# Patient Record
Sex: Female | Born: 1965 | Race: White | Hispanic: No | Marital: Married | Smoking: Never smoker
Health system: Southern US, Community
[De-identification: ages and names within clinical notes are randomized; demographics above are authoritative.]

## PROBLEM LIST (undated history)

## (undated) DIAGNOSIS — K589 Irritable bowel syndrome without diarrhea: Secondary | ICD-10-CM

## (undated) DIAGNOSIS — M549 Dorsalgia, unspecified: Secondary | ICD-10-CM

## (undated) DIAGNOSIS — R011 Cardiac murmur, unspecified: Secondary | ICD-10-CM

## (undated) DIAGNOSIS — A0472 Enterocolitis due to Clostridium difficile, not specified as recurrent: Secondary | ICD-10-CM

## (undated) DIAGNOSIS — R519 Headache, unspecified: Secondary | ICD-10-CM

## (undated) DIAGNOSIS — K219 Gastro-esophageal reflux disease without esophagitis: Secondary | ICD-10-CM

## (undated) DIAGNOSIS — Z9889 Other specified postprocedural states: Secondary | ICD-10-CM

## (undated) DIAGNOSIS — R51 Headache: Secondary | ICD-10-CM

## (undated) DIAGNOSIS — M5126 Other intervertebral disc displacement, lumbar region: Secondary | ICD-10-CM

## (undated) DIAGNOSIS — K5790 Diverticulosis of intestine, part unspecified, without perforation or abscess without bleeding: Secondary | ICD-10-CM

## (undated) DIAGNOSIS — F419 Anxiety disorder, unspecified: Secondary | ICD-10-CM

## (undated) DIAGNOSIS — K635 Polyp of colon: Secondary | ICD-10-CM

## (undated) DIAGNOSIS — R251 Tremor, unspecified: Secondary | ICD-10-CM

## (undated) DIAGNOSIS — R112 Nausea with vomiting, unspecified: Secondary | ICD-10-CM

## (undated) HISTORY — PX: COLONOSCOPY: SHX174

## (undated) HISTORY — PX: ESOPHAGOGASTRODUODENOSCOPY ENDOSCOPY: SHX5814

## (undated) HISTORY — DX: Anxiety disorder, unspecified: F41.9

## (undated) HISTORY — PX: OTHER SURGICAL HISTORY: SHX169

## (undated) HISTORY — PX: DILATION AND CURETTAGE OF UTERUS: SHX78

## (undated) HISTORY — PX: POLYPECTOMY: SHX149

---

## 2015-07-03 ENCOUNTER — Other Ambulatory Visit: Payer: Self-pay | Admitting: Family Medicine

## 2015-07-03 ENCOUNTER — Ambulatory Visit
Admission: RE | Admit: 2015-07-03 | Discharge: 2015-07-03 | Disposition: A | Discharge status: Home / Self Care | Payer: Medicaid Other | Source: Ambulatory Visit | Attending: Family Medicine | Admitting: Family Medicine

## 2015-07-03 DIAGNOSIS — R1031 Right lower quadrant pain: Secondary | ICD-10-CM | POA: Diagnosis present

## 2015-07-03 MED ORDER — IOHEXOL 300 MG/ML  SOLN
100.0000 mL | Freq: Once | INTRAMUSCULAR | Status: AC | PRN
  Administered 2015-07-03: 100 mL via INTRAVENOUS

## 2015-12-18 ENCOUNTER — Encounter: Payer: Self-pay | Admitting: Obstetrics and Gynecology

## 2015-12-18 ENCOUNTER — Other Ambulatory Visit: Payer: Self-pay | Admitting: Obstetrics and Gynecology

## 2015-12-18 ENCOUNTER — Ambulatory Visit (INDEPENDENT_AMBULATORY_CARE_PROVIDER_SITE_OTHER): Payer: Medicaid Other | Admitting: Obstetrics and Gynecology

## 2015-12-18 VITALS — BP 95/68 | HR 71 | Ht 60.0 in | Wt 169.6 lb

## 2015-12-18 DIAGNOSIS — E669 Obesity, unspecified: Secondary | ICD-10-CM | POA: Diagnosis not present

## 2015-12-18 DIAGNOSIS — N920 Excessive and frequent menstruation with regular cycle: Secondary | ICD-10-CM | POA: Diagnosis not present

## 2015-12-18 DIAGNOSIS — R5383 Other fatigue: Secondary | ICD-10-CM | POA: Diagnosis not present

## 2015-12-18 DIAGNOSIS — Z01419 Encounter for gynecological examination (general) (routine) without abnormal findings: Secondary | ICD-10-CM

## 2015-12-18 DIAGNOSIS — Z0000 Encounter for general adult medical examination without abnormal findings: Secondary | ICD-10-CM

## 2015-12-18 NOTE — Progress Notes (Signed)
Subjective:   Jordan Peters is a 50 y.o. G73P0 Caucasian female here for a routine well-woman exam.  Patient's last menstrual period was 12/10/2015 (exact date).    Current complaints: fatigue, menses heavy monthly, inability to lose weight with regular exercise PCP: none local       Does need labs  Social History: Sexual: heterosexual Marital Status: married Living situation: with family Occupation: homemaker Tobacco/alcohol: no tobacco use Illicit drugs: no history of illicit drug use  The following portions of the patient's history were reviewed and updated as appropriate: allergies, current medications, past family history, past medical history, past social history, past surgical history and problem list.  Past Medical History Past Medical History  Diagnosis Date  . Anxiety     Past Surgical History Past Surgical History  Procedure Laterality Date  . Uterine cyst      Gynecologic History G2P0  Patient's last menstrual period was 12/10/2015 (exact date). Contraception: vasectomy Last Pap: 2015. Results were: normal Last mammogram: 2015. Results were: normal  Obstetric History OB History  Gravida Para Term Preterm AB SAB TAB Ectopic Multiple Living  2         2    # Outcome Date GA Lbr Len/2nd Weight Sex Delivery Anes PTL Lv  2 Gravida 2001    F Vag-Spont   Y  1 Gravida 1998    F Vag-Spont   Y      Current Medications No current outpatient prescriptions on file prior to visit.   No current facility-administered medications on file prior to visit.    Review of Systems Patient denies any headaches, blurred vision, shortness of breath, chest pain, abdominal pain, problems with bowel movements, urination, or intercourse.  Objective:  BP 95/68 mmHg  Pulse 71  Ht 5' (1.524 m)  Wt 169 lb 9.6 oz (76.93 kg)  BMI 33.12 kg/m2  LMP 12/10/2015 (Exact Date) Physical Exam  General:  Well developed, well nourished, no acute distress. She is alert and oriented  x3. Skin:  Warm and dry Neck:  Midline trachea, no thyromegaly or nodules Cardiovascular: Regular rate and rhythm, no murmur heard Lungs:  Effort normal, all lung fields clear to auscultation bilaterally Breasts:  No dominant palpable mass, retraction, or nipple discharge Abdomen:  Soft, non tender, no hepatosplenomegaly or masses Pelvic:  External genitalia is normal in appearance.  The vagina is normal in appearance. The cervix is bulbous, no CMT.  Thin prep pap is done with HR HPV cotesting. Uterus is felt to be normal size, shape, and contour.  No adnexal masses or tenderness noted. Extremities:  No swelling or varicosities noted Psych:  She has a normal mood and affect  Assessment:   Healthy well-woman exam Fatigue Menorrhagia with regular cycles Obesity Anxiety under good control with current SSRI  Plan:  Pap and labs obtained Weight loss plan discussed Causes for heavy menses discussed Menopause and peri-menopause discussed F/U 1 year for AE, or sooner if needed Mammogram scheduled Will call if desires to start weight loss program here.  Jolane Bankhead Suzan Nailer, CNM

## 2015-12-18 NOTE — Patient Instructions (Signed)
  Place annual gynecologic exam patient instructions here.  Thank you for enrolling in MyChart. Please follow the instructions below to securely access your online medical record. MyChart allows you to send messages to your doctor, view your test results, manage appointments, and more.   How Do I Sign Up? 1. In your Internet browser, go to [employer] and enter [email or web address]. 2. Click on the Sign Up Now link in the Sign In box. You will see the New Member Sign Up page. 3. Enter your MyChart Access Code exactly as it appears below. You will not need to use this code after you've completed the sign-up process. If you do not sign up before the expiration date, you must request a new code.  MyChart Access Code: [email or web address] Expires: 02/15/2016  9:50 AM  4. Enter your Social Security Number ([id number]) and Date of Birth (mm/dd/yyyy) as indicated and click Submit. You will be taken to the next sign-up page. 5. Create a MyChart ID. This will be your MyChart login ID and cannot be changed, so think of one that is secure and easy to remember. 6. Create a MyChart password. You can change your password at any time. 7. Enter your Password Reset Question and Answer. This can be used at a later time if you forget your password.  8. Enter your e-mail address. You will receive e-mail notification when new information is available in MyChart. 9. Click Sign Up. You can now view your medical record.   Additional Information Remember, MyChart is NOT to be used for urgent needs. For medical emergencies, dial 911.

## 2015-12-19 ENCOUNTER — Other Ambulatory Visit: Payer: Self-pay | Admitting: Obstetrics and Gynecology

## 2015-12-19 DIAGNOSIS — E559 Vitamin D deficiency, unspecified: Secondary | ICD-10-CM | POA: Insufficient documentation

## 2015-12-19 LAB — COMPREHENSIVE METABOLIC PANEL
A/G RATIO: 1.7 (ref 1.1–2.5)
ALT: 16 IU/L (ref 0–32)
AST: 21 IU/L (ref 0–40)
Albumin: 4.4 g/dL (ref 3.5–5.5)
Alkaline Phosphatase: 72 IU/L (ref 39–117)
BILIRUBIN TOTAL: 0.3 mg/dL (ref 0.0–1.2)
BUN/Creatinine Ratio: 11 (ref 9–23)
BUN: 7 mg/dL (ref 6–24)
CALCIUM: 9.1 mg/dL (ref 8.7–10.2)
CHLORIDE: 100 mmol/L (ref 96–106)
CO2: 20 mmol/L (ref 18–29)
Creatinine, Ser: 0.66 mg/dL (ref 0.57–1.00)
GFR, EST AFRICAN AMERICAN: 122 mL/min/{1.73_m2} (ref >59)
GFR, EST NON AFRICAN AMERICAN: 106 mL/min/{1.73_m2} (ref >59)
GLOBULIN, TOTAL: 2.6 g/dL (ref 1.5–4.5)
Glucose: 71 mg/dL (ref 65–99)
POTASSIUM: 4.1 mmol/L (ref 3.5–5.2)
SODIUM: 139 mmol/L (ref 134–144)
TOTAL PROTEIN: 7 g/dL (ref 6.0–8.5)

## 2015-12-19 LAB — CBC
HEMATOCRIT: 41.9 % (ref 34.0–46.6)
Hemoglobin: 13.2 g/dL (ref 11.1–15.9)
MCH: 27.3 pg (ref 26.6–33.0)
MCHC: 31.5 g/dL (ref 31.5–35.7)
MCV: 87 fL (ref 79–97)
PLATELETS: 355 10*3/uL (ref 150–379)
RBC: 4.84 x10E6/uL (ref 3.77–5.28)
RDW: 14.5 % (ref 12.3–15.4)
WBC: 6 10*3/uL (ref 3.4–10.8)

## 2015-12-19 LAB — CYTOLOGY - PAP

## 2015-12-19 LAB — THYROID PANEL WITH TSH
Free Thyroxine Index: 2 (ref 1.2–4.9)
T3 Uptake Ratio: 28 % (ref 24–39)
T4 TOTAL: 7.3 ug/dL (ref 4.5–12.0)
TSH: 2.32 u[IU]/mL (ref 0.450–4.500)

## 2015-12-19 LAB — VITAMIN D 25 HYDROXY (VIT D DEFICIENCY, FRACTURES): VIT D 25 HYDROXY: 18.4 ng/mL — AB (ref 30.0–100.0)

## 2015-12-19 LAB — HEMOGLOBIN A1C
ESTIMATED AVERAGE GLUCOSE: 111 mg/dL
HEMOGLOBIN A1C: 5.5 % (ref 4.8–5.6)

## 2015-12-19 LAB — VITAMIN B12: VITAMIN B 12: 439 pg/mL (ref 211–946)

## 2015-12-19 LAB — IRON: Iron: 118 ug/dL (ref 27–159)

## 2015-12-19 MED ORDER — VITAMIN D (ERGOCALCIFEROL) 1.25 MG (50000 UNIT) PO CAPS: 50000.0000 [IU] | ORAL_CAPSULE | ORAL | Status: DC

## 2015-12-20 ENCOUNTER — Telehealth: Payer: Self-pay | Admitting: *Deleted

## 2015-12-20 NOTE — Telephone Encounter (Signed)
Mailed info on Vit d 

## 2015-12-20 NOTE — Telephone Encounter (Signed)
-----   Message from Purcell Nails, [place] sent at 12/19/2015 12:47 PM EST ----- Please mail info on vit D and schedule lab appt in 3 months-oders are in

## 2015-12-31 ENCOUNTER — Ambulatory Visit
Admission: RE | Admit: 2015-12-31 | Discharge: 2015-12-31 | Disposition: A | Discharge status: Home / Self Care | Payer: Medicaid Other | Source: Ambulatory Visit | Attending: Obstetrics and Gynecology | Admitting: Obstetrics and Gynecology

## 2015-12-31 DIAGNOSIS — Z1231 Encounter for screening mammogram for malignant neoplasm of breast: Secondary | ICD-10-CM | POA: Diagnosis present

## 2015-12-31 DIAGNOSIS — Z01419 Encounter for gynecological examination (general) (routine) without abnormal findings: Secondary | ICD-10-CM

## 2016-01-31 ENCOUNTER — Encounter: Payer: Self-pay | Admitting: Obstetrics and Gynecology

## 2016-01-31 ENCOUNTER — Ambulatory Visit (INDEPENDENT_AMBULATORY_CARE_PROVIDER_SITE_OTHER): Payer: Medicaid Other | Admitting: Obstetrics and Gynecology

## 2016-01-31 VITALS — BP 111/85 | HR 77 | Ht 60.0 in | Wt 169.3 lb

## 2016-01-31 DIAGNOSIS — E669 Obesity, unspecified: Secondary | ICD-10-CM | POA: Diagnosis not present

## 2016-01-31 MED ORDER — PHENTERMINE HCL 37.5 MG PO TABS: 37.5000 mg | ORAL_TABLET | Freq: Every day | ORAL | Status: DC

## 2016-01-31 MED ORDER — CYANOCOBALAMIN 1000 MCG/ML IJ SOLN: 1000.0000 ug | Freq: Once | INTRAMUSCULAR | Status: DC

## 2016-01-31 NOTE — Progress Notes (Signed)
Subjective:  Jordan HardyMelanie Peters is a 50 y.o. G2P0 at Unknown being seen today for weight loss management- initial visit.  Patient reports General ROS: negative and reports previous weight loss attempts: weight watches, fad diets, regular exercise.   The following portions of the patient's history were reviewed and updated as appropriate: allergies, current medications, past family history, past medical history, past social history, past surgical history and problem list.   Objective:   Filed Vitals:   01/31/16 1000  BP: 111/85  Pulse: 77  Height: 5' (1.524 m)  Weight: 169 lb 4.8 oz (76.794 kg)    General:  Alert, oriented and cooperative. Patient is in no acute distress.  :   :   :   :   :   :   PE: Well groomed female in no current distress,   Mental Status: Normal mood and affect. Normal behavior. Normal judgment and thought content.   Current BMI: Body mass index is 33.06 kg/(m^2).   Assessment and Plan:  Obesity  1. Obesity (BMI 30-39.9)    Plan: low carb, High protein diet RX for adipex 37.5 mg daily and B12 1000mcg.ml monthly, to start now with first injection given at today's visit. Reviewed side-effects common to both medications and expected outcomes. Increase daily water intake to at least 8 bottle a day, every day.  Goal is to reduse weight by 10% by end of three months, and will re-evaluate then.  RTC in 4 weeks for Nurse visit to check weight & BP, and get next B12 injections.    Please refer to After Visit Summary for other counseling recommendations.    Jordan Peters N [place], CNM   Jordan Peters NIKE Jordan Peters, CNM      Consider the Low Glycemic Index Diet and 6 smaller meals daily .  This boosts your metabolism and regulates your sugars:   Use the protein bar by Atkins because they have lots of fiber in them  Find the low carb flatbreads, tortillas and pita breads for sandwiches:  Joseph's makes a pita bread and a flat bread , available at [facility] and  [employer]; Toufayah makes a low carb flatbread available at [employer] and HT that is 9 net carbs and 100 cal Mission makes a low carb whole wheat tortilla available at [employer],and most grocery stores with 6 net carbs and 210 cal  [place] yogurt can still have a lot of carbs .  Dannon Light N fit has 80 cal and 8 carbs

## 2016-02-26 ENCOUNTER — Other Ambulatory Visit
Admission: RE | Admit: 2016-02-26 | Discharge: 2016-02-26 | Disposition: A | Discharge status: Home / Self Care | Payer: Medicaid Other | Source: Ambulatory Visit | Attending: Nurse Practitioner | Admitting: Nurse Practitioner

## 2016-02-26 DIAGNOSIS — K580 Irritable bowel syndrome with diarrhea: Secondary | ICD-10-CM | POA: Diagnosis present

## 2016-02-26 LAB — GASTROINTESTINAL PANEL BY PCR, STOOL (REPLACES STOOL CULTURE)
Adenovirus F40/41: NOT DETECTED
Astrovirus: NOT DETECTED
Campylobacter species: NOT DETECTED
Cryptosporidium: NOT DETECTED
Cyclospora cayetanensis: NOT DETECTED
E. coli O157: NOT DETECTED
Entamoeba histolytica: NOT DETECTED
Enteroaggregative E coli (EAEC): NOT DETECTED
Enteropathogenic E coli (EPEC): NOT DETECTED
Enterotoxigenic E coli (ETEC): NOT DETECTED
Giardia lamblia: NOT DETECTED
Norovirus GI/GII: NOT DETECTED
Plesimonas shigelloides: DETECTED — AB
Rotavirus A: NOT DETECTED
Salmonella species: NOT DETECTED
Sapovirus (I, II, IV, and V): NOT DETECTED
Shiga like toxin producing E coli (STEC): NOT DETECTED
Shigella/Enteroinvasive E coli (EIEC): NOT DETECTED
Vibrio cholerae: NOT DETECTED
Vibrio species: NOT DETECTED
Yersinia enterocolitica: NOT DETECTED

## 2016-02-26 LAB — C DIFFICILE QUICK SCREEN W PCR REFLEX
C Diff antigen: NEGATIVE
C Diff interpretation: NEGATIVE
C Diff toxin: NEGATIVE

## 2016-02-28 ENCOUNTER — Ambulatory Visit (INDEPENDENT_AMBULATORY_CARE_PROVIDER_SITE_OTHER): Payer: Medicaid Other | Admitting: Obstetrics and Gynecology

## 2016-02-28 VITALS — BP 108/72 | HR 83 | Wt 159.2 lb

## 2016-02-28 DIAGNOSIS — E669 Obesity, unspecified: Secondary | ICD-10-CM

## 2016-02-28 MED ORDER — CYANOCOBALAMIN 1000 MCG/ML IJ SOLN
1000.0000 ug | Freq: Once | INTRAMUSCULAR | Status: AC
  Administered 2016-02-28: 1000 ug via INTRAMUSCULAR

## 2016-02-28 NOTE — Progress Notes (Signed)
Patient ID: Jordan Peters, female   DOB: 04/08/1966, 50 y.o.   MRN: [id number] Pt presents for weight, B/P, B-12 injection. No side effects of medication-Phentermine, or B-12.  Weight loss of 10 lbs. Encouraged eating healthy and exercise. Pt had questions about her heart rate higher now. Usually pulse in 70's but may be as high as 176 when running or jogging. Pt informed that an increase pulse rate is normal when exercising and more so if on phentermine.  Pulse should return to normal. Pt denies any c/o shortening of breath, chest pain, heart palpitations or dizziness. To contact office or ER depending on symptoms if needed. .Marland Kitchen

## 2016-03-14 ENCOUNTER — Other Ambulatory Visit: Payer: Medicaid Other

## 2016-03-14 DIAGNOSIS — E559 Vitamin D deficiency, unspecified: Secondary | ICD-10-CM

## 2016-03-15 LAB — VITAMIN D 25 HYDROXY (VIT D DEFICIENCY, FRACTURES): Vit D, 25-Hydroxy: 82.4 ng/mL (ref 30.0–100.0)

## 2016-03-21 ENCOUNTER — Other Ambulatory Visit
Admission: RE | Admit: 2016-03-21 | Discharge: 2016-03-21 | Disposition: A | Discharge status: Home / Self Care | Payer: Medicaid Other | Source: Ambulatory Visit | Attending: Nurse Practitioner | Admitting: Nurse Practitioner

## 2016-03-21 DIAGNOSIS — R194 Change in bowel habit: Secondary | ICD-10-CM | POA: Diagnosis not present

## 2016-03-21 LAB — GASTROINTESTINAL PANEL BY PCR, STOOL (REPLACES STOOL CULTURE)

## 2016-03-27 ENCOUNTER — Ambulatory Visit: Payer: Medicaid Other

## 2016-03-27 ENCOUNTER — Ambulatory Visit (INDEPENDENT_AMBULATORY_CARE_PROVIDER_SITE_OTHER): Payer: Medicaid Other | Admitting: Obstetrics and Gynecology

## 2016-03-27 VITALS — BP 105/77 | HR 93 | Wt 153.2 lb

## 2016-03-27 DIAGNOSIS — E669 Obesity, unspecified: Secondary | ICD-10-CM

## 2016-03-27 MED ORDER — CYANOCOBALAMIN 1000 MCG/ML IJ SOLN
1000.0000 ug | Freq: Once | INTRAMUSCULAR | Status: AC
  Administered 2016-03-27: 1000 ug via INTRAMUSCULAR

## 2016-03-27 NOTE — Progress Notes (Signed)
Patient ID: Jordan Peters, female   DOB: 10/14/1966, 50 y.o.   MRN: [id number] Pt presents for weight, B/P, B-12 injection. No side effects of medication-Phentermine, or B-12.  Weight loss of  6  lbs. Encouraged eating healthy and exercise.

## 2016-04-24 ENCOUNTER — Encounter: Payer: Self-pay | Admitting: Obstetrics and Gynecology

## 2016-04-24 ENCOUNTER — Ambulatory Visit (INDEPENDENT_AMBULATORY_CARE_PROVIDER_SITE_OTHER): Payer: Medicaid Other | Admitting: Obstetrics and Gynecology

## 2016-04-24 VITALS — BP 85/62 | HR 91 | Wt 150.3 lb

## 2016-04-24 DIAGNOSIS — E669 Obesity, unspecified: Secondary | ICD-10-CM | POA: Diagnosis not present

## 2016-04-24 MED ORDER — PHENTERMINE HCL 37.5 MG PO TABS: 37.5000 mg | ORAL_TABLET | Freq: Every day | ORAL | Status: DC

## 2016-04-24 MED ORDER — CYANOCOBALAMIN 1000 MCG/ML IJ SOLN: 1000.0000 ug | Freq: Once | INTRAMUSCULAR | Status: DC

## 2016-04-24 NOTE — Progress Notes (Signed)
Patient ID: Jordan HardyMelanie Bierly, female   DOB: 09/22/1966, 50 y.o.   MRN: [id number] Pt is here for refill on her Phentermine, she is doing well, denies any s/e, pt desires refill on the phentermine  Starting weight was 169lb- 01/31/16

## 2016-05-25 ENCOUNTER — Ambulatory Visit
Admission: EM | Admit: 2016-05-25 | Discharge: 2016-05-25 | Disposition: A | Discharge status: Home / Self Care | Payer: Medicaid Other | Attending: Emergency Medicine | Admitting: Emergency Medicine

## 2016-05-25 DIAGNOSIS — G8929 Other chronic pain: Secondary | ICD-10-CM | POA: Diagnosis not present

## 2016-05-25 DIAGNOSIS — M549 Dorsalgia, unspecified: Secondary | ICD-10-CM | POA: Diagnosis not present

## 2016-05-25 DIAGNOSIS — M5432 Sciatica, left side: Secondary | ICD-10-CM | POA: Diagnosis not present

## 2016-05-25 DIAGNOSIS — M5431 Sciatica, right side: Secondary | ICD-10-CM | POA: Diagnosis not present

## 2016-05-25 HISTORY — DX: Dorsalgia, unspecified: M54.9

## 2016-05-25 HISTORY — DX: Other intervertebral disc displacement, lumbar region: M51.26

## 2016-05-25 MED ORDER — KETOROLAC TROMETHAMINE 60 MG/2ML IM SOLN
60.0000 mg | Freq: Once | INTRAMUSCULAR | Status: AC
  Administered 2016-05-25: 60 mg via INTRAMUSCULAR

## 2016-05-25 MED ORDER — BACLOFEN 10 MG PO TABS: 10.0000 mg | ORAL_TABLET | Freq: Three times a day (TID) | ORAL | 0 refills | Status: DC

## 2016-05-25 MED ORDER — CLONAZEPAM 0.5 MG PO TABS: 0.5000 mg | ORAL_TABLET | Freq: Two times a day (BID) | ORAL | 0 refills | Status: AC | PRN

## 2016-05-25 MED ORDER — NAPROXEN 500 MG PO TABS: 500.0000 mg | ORAL_TABLET | Freq: Two times a day (BID) | ORAL | 0 refills | Status: DC

## 2016-05-25 NOTE — ED Provider Notes (Signed)
CSN: [id number]     Arrival date & time 05/25/16  1302 History   None    Chief Complaint  Patient presents with  . Back Pain    Pt with right low back pain and shooting down both legs. Right leg feeling numb at times. Pt reports long hx of sciatica and herniated disc. Exacerbated by ? recent boot camp. Seen by [facility] Friday. Pain 9/10    (Consider location/radiation/quality/duration/timing/severity/associated sxs/prior Treatment) HPI  This a 50 year old female who is accompanied by her husband who complains of low back pain with radiation into her right lower extremity in a sciatic distribution and also in the left lower leg and not as intense. Numbness and tingling into her right leg. History of herniated nucleus pulposus being considered for surgery but she did not wish to have that and has had a waxing and waning course since that time. 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 Her previous job she would walk 4 miles per day in the back does not bother her at that time. She has no incontinence. She states sitting seems to be her worst position. She has seen a chiropractor who performed ice TENS and adjustments but they did not do seem to be lasting.  Past Medical History:  Diagnosis Date  . Anxiety   . Back pain   . Lumbar herniated disc    Past Surgical History:  Procedure Laterality Date  . uterine cyst     Family History  Problem Relation Age of Onset  . Breast cancer Paternal Aunt   . Hypertension Father   . Cancer Father     bladder   Social History  Substance Use Topics  . Smoking status: Never Smoker  . Smokeless tobacco: Never Used  . Alcohol use Yes     Comment: social   OB History    Gravida Para Term Preterm AB Living   2         2   SAB TAB Ectopic Multiple Live Births                 Review of Systems  Constitutional: Positive for activity change. Negative for appetite change, chills,  fatigue and fever.  Musculoskeletal: Positive for back pain and myalgias.  All other systems reviewed and are negative.   Allergies  Sulfa antibiotics  Home Medications   Prior to Admission medications   Medication Sig Start Date End Date Taking? Authorizing Provider  diclofenac (CATAFLAM) 50 MG tablet Take 50 mg by mouth 3 (three) times daily.   Yes Historical Provider, MD  FLUoxetine (PROZAC) 10 MG tablet Take 10 mg by mouth daily. Reported on 01/31/2016   Yes Historical Provider, MD  HYDROcodone-acetaminophen (NORCO/VICODIN) 5-325 MG tablet Take 1 tablet by mouth every 6 (six) hours as needed for moderate pain.   Yes Historical Provider, MD  phentermine (ADIPEX-P) 37.5 MG tablet Take 1 tablet (37.5 mg total) by mouth daily before breakfast. 04/24/16  Yes Melody N Shambley, CNM  Vitamin D, Ergocalciferol, (DRISDOL) 50000 units CAPS capsule Take 1 capsule (50,000 Units total) by mouth 2 (two) times a week. 12/19/15  Yes Melody N Shambley, CNM  baclofen (LIORESAL) 10 MG tablet Take 1 tablet (10 mg total) by mouth 3 (three) times daily. 05/25/16   Lutricia Feil, PA-C  clonazePAM (KLONOPIN) 0.5 MG tablet Take 1 tablet (0.5 mg total) by mouth 2 (two) times daily as  needed (muscle spasm). 05/25/16   Lutricia Feil, PA-C  cyanocobalamin (,VITAMIN B-12,) 1000 MCG/ML injection Inject 1 mL (1,000 mcg total) into the muscle once. 04/24/16   Melody N Shambley, CNM  fluticasone (FLONASE) 50 MCG/ACT nasal spray Place into both nostrils daily.    Historical Provider, MD  naproxen (NAPROSYN) 500 MG tablet Take 1 tablet (500 mg total) by mouth 2 (two) times daily with a meal. 05/25/16   Lutricia Feil, PA-C   Meds Ordered and Administered this Visit   Medications  ketorolac (TORADOL) injection 60 mg (60 mg Intramuscular Given 05/25/16 1506)    BP 115/71 (BP Location: Left Arm)   Pulse 72   Temp 98.4 F (36.9 C) (Oral)   Resp 20   Ht 5' (1.524 m)   Wt 150 lb (68 kg)   LMP 05/20/2016   SpO2 100%    BMI 29.29 kg/m  No data found.   Physical Exam  Constitutional: She appears well-developed and well-nourished. No distress.  HENT:  Head: Normocephalic and atraumatic.  Eyes: EOM are normal. Pupils are equal, round, and reactive to light.  Neck: Normal range of motion.  Musculoskeletal:  Examination lumbar spine shows a level pelvis in stance. Patient has limitation of motion due to pain and muscle spasm. Is able to toe and heel walk adequately. Sedation in the lower extremities is intact to light touch. Yards are slightly hyperreflexic and symmetrical. EHL peroneal and anterior tibialis are strong to clinical testing. Straight leg raise testing is positive in the sitting position at 45 bilaterally with low back pain. Tenderness is in the sacroiliac joint more on the right than the left also in the lower paraspinous muscles right more than left.  Skin: She is not diaphoretic.  Nursing note and vitals reviewed.   Urgent Care Course   Clinical Course    Procedures (including critical care time)  Labs Review Labs Reviewed - No data to display  Imaging Review No results found.   Visual Acuity Review  Right Eye Distance:   Left Eye Distance:   Bilateral Distance:    Right Eye Near:   Left Eye Near:    Bilateral Near:     Medications  ketorolac (TORADOL) injection 60 mg (60 mg Intramuscular Given 05/25/16 1506)      MDM   1. Bilateral sciatica   2. Chronic back pain    New Prescriptions   NAPROXEN (NAPROSYN) 500 MG TABLET    Take 1 tablet (500 mg total) by mouth 2 (two) times daily with a meal.  Plan: 1. Test/x-ray results and diagnosis reviewed with patient 2. rx as per orders; risks, benefits, potential side effects reviewed with patient 3. Recommend supportive treatment with Rest, heat, and ice for comfort and avoid sitting lifting and bending to exacerbate her symptoms. May find standing at her work desk may be more beneficial. She should stop her boot camp at  this point in time and find another exercise that is not so strenuous on her low back. Of course she should start  only after she has resolved the current issues. Follow up with her primary care tomorrow. 4. F/u prn if symptoms worsen or don't improve     Lutricia Feil, PA-C 05/25/16 1518

## 2016-05-29 ENCOUNTER — Ambulatory Visit: Payer: Medicaid Other

## 2016-06-04 ENCOUNTER — Encounter: Payer: Self-pay | Admitting: Physical Therapy

## 2016-06-04 ENCOUNTER — Ambulatory Visit: Payer: Managed Care, Other (non HMO) | Attending: Pediatrics | Admitting: Physical Therapy

## 2016-06-04 DIAGNOSIS — M6281 Muscle weakness (generalized): Secondary | ICD-10-CM | POA: Insufficient documentation

## 2016-06-04 DIAGNOSIS — R262 Difficulty in walking, not elsewhere classified: Secondary | ICD-10-CM | POA: Diagnosis present

## 2016-06-04 DIAGNOSIS — M5441 Lumbago with sciatica, right side: Secondary | ICD-10-CM | POA: Insufficient documentation

## 2016-06-04 DIAGNOSIS — M5442 Lumbago with sciatica, left side: Secondary | ICD-10-CM | POA: Diagnosis present

## 2016-06-04 DIAGNOSIS — R293 Abnormal posture: Secondary | ICD-10-CM | POA: Insufficient documentation

## 2016-06-04 NOTE — Therapy (Signed)
[facility] [address]. [place], [place], [address] Phone: [phone]   Fax:  [phone]  Physical Therapy Treatment  Patient Details  Name: Jordan Peters MRN: [id number] Date of Birth: 1966-09-30 Referring Provider: Jackelyn Hoehn MD  Encounter Date: 06/04/2016      PT End of Session - 06/04/16 1226    Visit Number 1   Number of Visits 8   Date for PT Re-Evaluation 06/06/15   PT Start Time 0901   PT Stop Time 0946   PT Time Calculation (min) 45 min   Activity Tolerance Patient tolerated treatment well;Patient limited by pain   Behavior During Therapy [facility] for tasks assessed/performed      Past Medical History:  Diagnosis Date  . Anxiety   . Back pain   . Lumbar herniated disc     Past Surgical History:  Procedure Laterality Date  . uterine cyst      There were no vitals filed for this visit.      Subjective Assessment - 06/04/16 1124    Subjective Pt reports chronic hx of LBP and sciatica spanning over 20 years. She states that in the past she has recieved physical therapy services in [place] with benefit. Her current bout of low back pain started approx 2 weeks ago following a [employer]. She went to Urgent Care where she recieved a cortisone injection; states she had immediate benefit. She was started on a prednisone taper to control LBP and has found mild relief with that but still reports approx 5/10 LBP with R/L sciatica pain currently.    Pertinent History Pt with chronic hx of LBP and sciatica (approx 20 years). MRI in 2012 revealed herniated disc. Patient recieved benefit from [facility] extension based physical therapy.   Limitations Walking;House hold activities   How long can you walk comfortably? 1 mile; reports onset of LBP   Patient Stated Goals pt would like to participate in exercise program with no c/o    Currently in Pain? Yes   Pain Score 5    Pain Location Back   Pain Orientation Lower   Pain Onset 1 to 4 weeks ago   Aggravating Factors  walking, lifting, bending forward, activities that incorporate lumbar flexion   Pain Relieving Factors lumbar extension, ice, TENS          Objective:  Therapeutic Exercise: Hooklying piriformis stretch 45sec x2. Standing lumbar ext x10. Prone McKenzie ext x10. Issued core stability progression program: reviewed TA activation and pelvic tilt with TA activation 10 sec holds x5.   Pt response for medical necessity: Pt presents with generalized hypomobility of lumbar spine with sciatica in bilateral LE. She experiences relief of LBP sx with prone lying and is able to tolerate issued exercises with no increase in pain. She will benefit from skilled PT to progress lumbar spine mobility and develop core strength to promote return to active lifestyle.       PT Education - 06/04/16 1222    Education provided Yes   Education Details see HEP; TA progression   Person(s) Educated Patient   Methods Explanation;Demonstration;Handout   Comprehension Verbalized understanding;Returned demonstration;Verbal cues required            PT Long Term Goals - 06/04/16 1237      PT LONG TERM GOAL #1   Title Pt will score <35% on Modified Oswestry to increase self percieved mobility   Baseline 8/9: 50% Severe Self Percieved Disability   Time  4   Period Weeks   Status New     PT LONG TERM GOAL #2   Title Pt will be able to touch her toes without >2/10 LBP to promote return to PLOF   Baseline pt able to tolerate fingertips to proximal knee before LBP >5/10   Time 4   Period Weeks   Status New     PT LONG TERM GOAL #3   Title Pt will be able to lift 5lbs with no <3/10 LBP with proper biomechanics/posture   Baseline pt only able to lift very light weights with 5/10 LBP   Time 4   Period Weeks   Status New     PT LONG TERM GOAL #4   Title Pt will be able to ambulate 2 miles with >3/10 LBP to promote return to regular exercise program    Baseline pt able to tolerate <1 mile; currently unable to exercise   Time 4   Period Weeks   Status New             Plan - 06/04/16 1226    Clinical Impression Statement Pt is a pleasant 50 year old female referred to physical therapy for acute exacerbation of chronic LBP and sciatica. Pain: Current 5/10, Worst 10/10, Best 0/10 following injection. Agg Factors: forward reaching, walking > 1 mile Ease Factors: Ice/TENS. ROM: Lumbar flx: finger tips proximal to knee at onset of LBP, Lumbar ext: mild hypomobility x10 with no reduction of sx. L LF: right sided LBP. R LF: right sided sciatica. R rot: central LBP. L rot: R sided pain. Strength: hip flexion R/L 4-/4, knee ext/flx: pain limited due to sciatica, dorsi/planar: 5/5 bilaterally, hip extension 4/4. Sensation: pt intact to LT; reports mild N/T in R leg. Pt denies bowel bladder issues. Palpation: CPA to lumbar spine generally hypomobile with pt reporting "feel good" sensation with Grade II-IV passive accessories. Muscle length: tight piriformis bilaterally with R>L. Special Test: Slump Test R/L +, SLR: R+. Outcome Measures:   Rehab Potential Good   Clinical Impairments Affecting Rehab Potential Positive: Age, Previous response to therapy, Active. Negative: Chronic   PT Frequency 2x / week   PT Duration 4 weeks   PT Treatment/Interventions ADLs/Self Care Home Management;Aquatic Therapy;Cryotherapy;Electrical Stimulation;Moist Heat;Traction;Gait training;Stair training;Functional mobility training;Therapeutic activities;Therapeutic exercise;Balance training;Neuromuscular re-education;Patient/family education;Manual techniques   PT Next Visit Plan passive accessory to lumbar with ext, muscle length testing, core progression   Consulted and Agree with Plan of Care Patient      Patient will benefit from skilled therapeutic intervention in order to improve the following deficits and impairments:     Visit Diagnosis: Difficulty in walking, not  elsewhere classified  Bilateral low back pain with sciatica, sciatica laterality unspecified  Muscle weakness (generalized)  Abnormal posture     Problem List Patient Active Problem List   Diagnosis Date Noted  . Vitamin D deficiency 12/19/2015   Cammie McgeeMichael C Sherk, PT, DPT # [phone] Michaelyn BarterLaura Kayln Garceau, SPT 06/05/2016, 1:05 PM  [facility] [address] [place], [place], [address] Phone: [phone]   Fax:  [phone]  Name: Jordan Peters MRN: [id number] Date of Birth: 07/03/1966

## 2016-06-06 ENCOUNTER — Ambulatory Visit: Payer: Medicaid Other

## 2016-06-10 ENCOUNTER — Ambulatory Visit: Payer: Managed Care, Other (non HMO) | Admitting: Physical Therapy

## 2016-06-10 ENCOUNTER — Encounter: Payer: Self-pay | Admitting: Physical Therapy

## 2016-06-10 DIAGNOSIS — M5441 Lumbago with sciatica, right side: Secondary | ICD-10-CM | POA: Diagnosis present

## 2016-06-10 DIAGNOSIS — M5442 Lumbago with sciatica, left side: Secondary | ICD-10-CM | POA: Diagnosis present

## 2016-06-10 DIAGNOSIS — R293 Abnormal posture: Secondary | ICD-10-CM

## 2016-06-10 DIAGNOSIS — M6281 Muscle weakness (generalized): Secondary | ICD-10-CM | POA: Diagnosis present

## 2016-06-10 DIAGNOSIS — R262 Difficulty in walking, not elsewhere classified: Secondary | ICD-10-CM

## 2016-06-10 NOTE — Therapy (Signed)
[place] [facility] [address] [place], [place], [address] Phone: [phone]   Fax:  [phone]  Physical Therapy Treatment  Patient Details  Name: Jordan HardyMelanie Sleight MRN: [id number] Date of Birth: 09/13/1966 Referring Provider: Jackelyn HoehnKaren Jutta Behling MD  Encounter Date: 06/10/2016      PT End of Session - 06/10/16 1526    Visit Number 2   Number of Visits 8   Date for PT Re-Evaluation 06/06/15   PT Start Time 0931   PT Stop Time 1024   PT Time Calculation (min) 53 min   Activity Tolerance Patient tolerated treatment well   Behavior During Therapy [facility] for tasks assessed/performed      Past Medical History:  Diagnosis Date  . Anxiety   . Back pain   . Lumbar herniated disc     Past Surgical History:  Procedure Laterality Date  . uterine cyst      There were no vitals filed for this visit.      Subjective Assessment - 06/10/16 0935    Subjective Pt states that she has been performing HEP with mild benefit. Notes that she would prefer not to sit down today due to continued onset of LBP with forward reaching activities. Pt states that prior to back injury she had a tendency for hypermobility and good flexibility for all joints. States that she would like to try meditation/yoga and feels it would be beneficial for management of low back pain. Pt currently wearing icy hot patch at time of appointment.   Pertinent History Pt with chronic hx of LBP and sciatica (approx 20 years). MRI in 2012 revealed herniated disc. [facility].   Limitations Walking;House hold activities   How long can you walk comfortably? 1 mile; reports onset of LBP   Patient Stated Goals pt would like to participate in exercise program with no c/o    Currently in Pain? Yes   Pain Score 3    Pain Location Back   Pain Orientation Lower   Pain Onset More than a month ago   Aggravating Factors  walking,  lifting, bending forward, lumbar flexion      Objective:  Therapeutic Exercise: Standing lumbar ext x10. Prone lumbar ext elbows -- hands 2x10. Lumbar rotation in hooklying x10. TrA activation drills: including isometric TrA hold with pelvic tilt, heel slide, marching, bridge and beginning bike.  Manual tx: STM to R paraspinals, QL and piriformis with palpable release of R paraspinals following tx. Passive accessory mobilizations: PA L2-L5, central sacral mobilization (2 bouts ea, 3 minutes); pt performed lumbar ext snag following ea bout.   Pt response for medical necessity: Pt demonstrates hypomobility of lumbar spine ROM and passive accessories with ext preference. She demonstrates good tolerance of manual therapy this treatment with no increase in LBP and mild release of surrounding musculature.        PT Long Term Goals - 06/04/16 1237      PT LONG TERM GOAL #1   Title Pt will score <35% on Modified Oswestry to increase self percieved mobility   Baseline 8/9: 50% Severe Self Percieved Disability   Time 4   Period Weeks   Status New     PT LONG TERM GOAL #2   Title Pt will be able to touch her toes without >2/10 LBP to promote return to PLOF   Baseline pt able to tolerate fingertips to proximal knee before LBP >5/10   Time  4   Period Weeks   Status New     PT LONG TERM GOAL #3   Title Pt will be able to lift 5lbs with no <3/10 LBP with proper biomechanics/posture   Baseline pt only able to lift very light weights with 5/10 LBP   Time 4   Period Weeks   Status New     PT LONG TERM GOAL #4   Title Pt will be able to ambulate 2 miles with >3/10 LBP to promote return to regular exercise program   Baseline pt able to tolerate <1 mile; currently unable to exercise   Time 4   Period Weeks   Status New           Plan - 06/10/16 1527    Clinical Impression Statement Pt demonstrates functional flexibility of bilateral quadriceps with tight hamstrings/piriformis.  Demonstrates improved tolerance to passive stretching program this session with hamstring/piriformis stretch with only mild LBP with R passive straight leg raise. Pt with moderate hypertonicity of R paraspinals with palpable benefit after STM and passive accessory mobilizations.    Rehab Potential Poor   Clinical Impairments Affecting Rehab Potential Positive: Age, Previous response to therapy, Active. Negative: Chronic   PT Frequency 2x / week   PT Duration 4 weeks   PT Treatment/Interventions ADLs/Self Care Home Management;Aquatic Therapy;Cryotherapy;Electrical Stimulation;Moist Heat;Traction;Gait training;Stair training;Functional mobility training;Therapeutic activities;Therapeutic exercise;Balance training;Neuromuscular re-education;Patient/family education;Manual techniques   PT Next Visit Plan core progression, cont mckenzie extension based program   PT Home Exercise Plan TrA activation with piriformis/hamstring stretching program   Consulted and Agree with Plan of Care Patient      Patient will benefit from skilled therapeutic intervention in order to improve the following deficits and impairments:  Decreased activity tolerance, Decreased mobility, Decreased range of motion, Decreased strength, Hypomobility, Increased muscle spasms, Impaired flexibility, Improper body mechanics, Pain  Visit Diagnosis: Difficulty in walking, not elsewhere classified  Bilateral low back pain with sciatica, sciatica laterality unspecified  Muscle weakness (generalized)  Abnormal posture     Problem List Patient Active Problem List   Diagnosis Date Noted  . Vitamin D deficiency 12/19/2015   Cammie McgeeMichael C Sherk, PT, DPT # [id number] Michaelyn BarterLaura Asiah Browder, SPT 06/11/2016, 9:23 AM  [place] [facility] [address] [place], [place], [address] Phone: [phone]   Fax:  [phone]  Name: Jordan HardyMelanie Gangemi MRN: [id number] Date of Birth: 09/02/1966

## 2016-06-12 ENCOUNTER — Ambulatory Visit: Payer: Managed Care, Other (non HMO) | Admitting: Physical Therapy

## 2016-06-12 ENCOUNTER — Encounter: Payer: Self-pay | Admitting: Physical Therapy

## 2016-06-12 DIAGNOSIS — R262 Difficulty in walking, not elsewhere classified: Secondary | ICD-10-CM | POA: Diagnosis not present

## 2016-06-12 DIAGNOSIS — R293 Abnormal posture: Secondary | ICD-10-CM

## 2016-06-12 DIAGNOSIS — M6281 Muscle weakness (generalized): Secondary | ICD-10-CM

## 2016-06-12 DIAGNOSIS — M5442 Lumbago with sciatica, left side: Secondary | ICD-10-CM

## 2016-06-12 DIAGNOSIS — M5441 Lumbago with sciatica, right side: Secondary | ICD-10-CM

## 2016-06-12 NOTE — Therapy (Signed)
[place] [facility] [place] [facility] [address] [place], [place], [address] Phone: [phone]   Fax:  [phone]  Physical Therapy Treatment  Patient Details  Name: Jordan HardyMelanie Peters MRN: [id number] Date of Birth: 07/17/1966 Referring Provider: Jackelyn HoehnKaren Jutta Behling MD  Encounter Date: 06/12/2016      PT End of Session - 06/12/16 1545    Visit Number 3   Number of Visits 8   Date for PT Re-Evaluation 06/06/15   PT Start Time 0915   PT Stop Time 1001   PT Time Calculation (min) 46 min   Activity Tolerance Patient tolerated treatment well   Behavior During Therapy [facility] for tasks assessed/performed      Past Medical History:  Diagnosis Date  . Anxiety   . Back pain   . Lumbar herniated disc     Past Surgical History:  Procedure Laterality Date  . uterine cyst      There were no vitals filed for this visit.      Subjective Assessment - 06/12/16 1544    Subjective Pt states that she has kept up with HEP and frequently performs prone on elbows when she is getting up in the morning to limit LBP.    Pertinent History Pt with chronic hx of LBP and sciatica (approx 20 years). MRI in 2012 revealed herniated disc. Patient [facility].   Limitations Walking;House hold activities   How long can you walk comfortably? 1 mile; reports onset of LBP   Patient Stated Goals pt would like to participate in exercise program with no c/o    Currently in Pain? No/denies      Objective:  Therapeutic Exercise: Prone ext on elbows x10. Lumbar extension over theraball, supine knee to chest/ bridging/ lumbar rotations with white theraball 20x each, seated hip flexion/ LAQ on theraball with TrA contraction.   Manual tx: STM to R paraspinals, QL and piriformis with palpable release of R paraspinals following tx. Passive accessory mobilizations: UPA L2-L5, CPA L2-L5 central sacral mobilization (2 bouts ea, 1 minutes.  Grade II-III).  Pt response for medical necessity: Pt demonstrates hypomobility of lumbar spine ROM and passive accessories with ext preference. She demonstrates good tolerance of manual therapy this treatment with no increase in LBP and mild release of surrounding musculature       PT Long Term Goals - 06/04/16 1237      PT LONG TERM GOAL #1   Title Pt will score <35% on Modified Oswestry to increase self percieved mobility   Baseline 8/9: 50% Severe Self Percieved Disability   Time 4   Period Weeks   Status New     PT LONG TERM GOAL #2   Title Pt will be able to touch her toes without >2/10 LBP to promote return to PLOF   Baseline pt able to tolerate fingertips to proximal knee before LBP >5/10   Time 4   Period Weeks   Status New     PT LONG TERM GOAL #3   Title Pt will be able to lift 5lbs with no <3/10 LBP with proper biomechanics/posture   Baseline pt only able to lift very light weights with 5/10 LBP   Time 4   Period Weeks   Status New     PT LONG TERM GOAL #4   Title Pt will be able to ambulate 2 miles with >3/10 LBP to promote return to regular exercise program   Baseline pt able to  tolerate <1 mile; currently unable to exercise   Time 4   Period Weeks   Status New               Plan - 06/12/16 1545    Clinical Impression Statement Pt with moderate hypomobility of sacrum, R UPA L2-L5. CPA L2-L5 Tenderness to palpation of R lumbar paraspinal and piriformis. Demonstrates good tolerance of core progression program with no increase in LBP during tx session including seated theraball exercises.   Rehab Potential Good   Clinical Impairments Affecting Rehab Potential Positive: Age, Previous response to therapy, Active. Negative: Chronic   PT Frequency 2x / week   PT Duration 4 weeks   PT Treatment/Interventions ADLs/Self Care Home Management;Aquatic Therapy;Cryotherapy;Electrical Stimulation;Moist Heat;Traction;Gait training;Stair training;Functional mobility  training;Therapeutic activities;Therapeutic exercise;Balance training;Neuromuscular re-education;Patient/family education;Manual techniques   PT Next Visit Plan core progression, cont mckenzie extension based program   PT Home Exercise Plan TrA activation with piriformis/hamstring stretching program   Consulted and Agree with Plan of Care Patient      Patient will benefit from skilled therapeutic intervention in order to improve the following deficits and impairments:  Decreased activity tolerance, Decreased mobility, Decreased range of motion, Decreased strength, Hypomobility, Increased muscle spasms, Impaired flexibility, Improper body mechanics, Pain  Visit Diagnosis: Difficulty in walking, not elsewhere classified  Bilateral low back pain with sciatica, sciatica laterality unspecified  Muscle weakness (generalized)  Abnormal posture     Problem List Patient Active Problem List   Diagnosis Date Noted  . Vitamin D deficiency 12/19/2015   Cammie McgeeMichael C Sherk, PT, DPT # [phone] Vernona RiegerLaura Pansey Pinheiro SPT 06/12/2016, 3:48 PM  [place] [facility] [address] [place], [place], [address] Phone: [phone]   Fax:  [phone]  Name: Jordan HardyMelanie Peters MRN: [id number] Date of Birth: 01/21/1966

## 2016-06-17 ENCOUNTER — Encounter: Payer: Self-pay | Admitting: Physical Therapy

## 2016-06-17 ENCOUNTER — Ambulatory Visit: Payer: Managed Care, Other (non HMO) | Admitting: Physical Therapy

## 2016-06-17 DIAGNOSIS — R293 Abnormal posture: Secondary | ICD-10-CM

## 2016-06-17 DIAGNOSIS — R262 Difficulty in walking, not elsewhere classified: Secondary | ICD-10-CM | POA: Diagnosis not present

## 2016-06-17 DIAGNOSIS — M5442 Lumbago with sciatica, left side: Secondary | ICD-10-CM

## 2016-06-17 DIAGNOSIS — M6281 Muscle weakness (generalized): Secondary | ICD-10-CM

## 2016-06-17 DIAGNOSIS — M5441 Lumbago with sciatica, right side: Secondary | ICD-10-CM

## 2016-06-18 NOTE — Therapy (Signed)
[place] [facility] [address] [place], [place], [address] Phone: [phone]   Fax:  [phone]  Physical Therapy Treatment  Patient Details  Name: Jordan Peters MRN: [id number] Date of Birth: 12/05/1965 Referring Provider: Jackelyn HoehnKaren Jutta Behling MD  Encounter Date: 06/17/2016      PT End of Session - 06/18/16 1314    Visit Number 4   Number of Visits 8   Date for PT Re-Evaluation 07/02/16   PT Start Time 0911   PT Stop Time 1000   PT Time Calculation (min) 49 min   Activity Tolerance Patient tolerated treatment well   Behavior During Therapy [facility] for tasks assessed/performed      Past Medical History:  Diagnosis Date  . Anxiety   . Back pain   . Lumbar herniated disc     Past Surgical History:  Procedure Laterality Date  . uterine cyst      There were no vitals filed for this visit.      Subjective Assessment - 06/18/16 1255    Subjective Pt. entered PT with no new complaints or pain.   Pt. states she has attempted several of the ball ex. that were discussed/ demonstrated last PT tx. session.     Pertinent History Pt with chronic hx of LBP and sciatica (approx 20 years). MRI in 2012 revealed herniated disc. [facility].   Limitations Walking;House hold activities   How long can you walk comfortably? 1 mile; reports onset of LBP   Patient Stated Goals pt would like to participate in exercise program with no c/o    Currently in Pain? No/denies       Objective:  Therapeutic Exercise: Prone ext on elbows x 5 (no pain).  Planks on knees (blue mat table) 5x with 20+ sec. Holds and proper technique.  Lumbar extension over theraball/ lateral flexion on L/R side with white ball 3x each (good technique but extra time to get into position), supine knee to chest/ bridging/ lumbar rotations with white theraball 20x each, seated hip flexion/ LAQ on theraball with TrA  contraction.  Sidelying clamshells L/R 20x each/ hip abd. With SLR 20x each.   Manual tx: STM to R paraspinals, QL and piriformis with palpable release of R paraspinals following tx. Passive accessory mobilizations: UPA L2-L5, CPA L2-L5 central sacral mobilization (2 bouts ea, 1 minutes. Grade II-III).  Supine L/R LE neural glides 3x each (as tolerated)- increase in R lumbar/SI region.    PT discussed/reviewed proper set-up/ placement of IFC TENS (pts. Personal home unit).    Pt response for medical necessity: Pt demonstrates min. hypomobility of lumbar spine ROM and passive accessories with ext preference. She demonstrates good tolerance of manual therapy this treatment with no increase in LBP and mild release of surrounding musculature         PT Long Term Goals - 06/04/16 1237      PT LONG TERM GOAL #1   Title Pt will score <35% on Modified Oswestry to increase self percieved mobility   Baseline 8/9: 50% Severe Self Percieved Disability   Time 4   Period Weeks   Status New     PT LONG TERM GOAL #2   Title Pt will be able to touch her toes without >2/10 LBP to promote return to PLOF   Baseline pt able to tolerate fingertips to proximal knee before LBP >5/10   Time 4   Period Weeks  Status New     PT LONG TERM GOAL #3   Title Pt will be able to lift 5lbs with no <3/10 LBP with proper biomechanics/posture   Baseline pt only able to lift very light weights with 5/10 LBP   Time 4   Period Weeks   Status New     PT LONG TERM GOAL #4   Title Pt will be able to ambulate 2 miles with >3/10 LBP to promote return to regular exercise program   Baseline pt able to tolerate <1 mile; currently unable to exercise   Time 4   Period Weeks   Status New            Plan - 06/18/16 1316    Clinical Impression Statement Pt. able to complete higher level core stability ex./ lumbar ext. and lateral flexion stretches over ball with no increase c/o back pain.  Pt. did have an increase in  R lumbar/SI discomfort during L SLR/ neural glides in spuine position on blue mat table.  Pain resolved quickly with return to resting position.  (+) R lumbar paraspinal/ SI tenderness with palpation and grade II-III mobs.       Rehab Potential Good   Clinical Impairments Affecting Rehab Potential Positive: Age, Previous response to therapy, Active. Negative: Chronic   PT Frequency 2x / week   PT Duration 4 weeks   PT Treatment/Interventions ADLs/Self Care Home Management;Aquatic Therapy;Cryotherapy;Electrical Stimulation;Moist Heat;Traction;Gait training;Stair training;Functional mobility training;Therapeutic activities;Therapeutic exercise;Balance training;Neuromuscular re-education;Patient/family education;Manual techniques   PT Next Visit Plan core progression, cont mckenzie extension based program.  Reassess lumbar mobility and tenderness next tx. session.    PT Home Exercise Plan TrA activation with piriformis/hamstring stretching program   Consulted and Agree with Plan of Care Patient      Patient will benefit from skilled therapeutic intervention in order to improve the following deficits and impairments:  Decreased activity tolerance, Decreased mobility, Decreased range of motion, Decreased strength, Hypomobility, Increased muscle spasms, Impaired flexibility, Improper body mechanics, Pain  Visit Diagnosis: Difficulty in walking, not elsewhere classified  Bilateral low back pain with sciatica, sciatica laterality unspecified  Muscle weakness (generalized)  Abnormal posture     Problem List Patient Active Problem List   Diagnosis Date Noted  . Vitamin D deficiency 12/19/2015   Cammie McgeeMichael C Divya Munshi, PT, DPT # [id number]  06/18/2016, 1:27 PM  [place] [facility] [address] [place], [place], [address] Phone: [phone]   Fax:  [phone]  Name: Jordan Peters MRN: [id number] Date of Birth: 08/07/1966

## 2016-06-19 ENCOUNTER — Ambulatory Visit: Payer: Managed Care, Other (non HMO) | Admitting: Physical Therapy

## 2016-06-19 ENCOUNTER — Encounter: Payer: Self-pay | Admitting: Physical Therapy

## 2016-06-19 DIAGNOSIS — R262 Difficulty in walking, not elsewhere classified: Secondary | ICD-10-CM | POA: Diagnosis not present

## 2016-06-19 DIAGNOSIS — M6281 Muscle weakness (generalized): Secondary | ICD-10-CM

## 2016-06-19 DIAGNOSIS — M5441 Lumbago with sciatica, right side: Secondary | ICD-10-CM

## 2016-06-19 DIAGNOSIS — M5442 Lumbago with sciatica, left side: Secondary | ICD-10-CM

## 2016-06-19 DIAGNOSIS — R293 Abnormal posture: Secondary | ICD-10-CM

## 2016-06-19 NOTE — Therapy (Signed)
[place] [facility] [address] [place], [place], [address] Phone: [phone]   Fax:  [phone]  Physical Therapy Treatment  Patient Details  Name: Jordan Peters MRN: [id number] Date of Birth: Mar 23, 1966 Referring Provider: Jackelyn Hoehn MD  Encounter Date: 06/19/2016      PT End of Session - 06/19/16 0921    Visit Number 5   Number of Visits 8   Date for PT Re-Evaluation 07/02/16   PT Start Time 0915   PT Stop Time 1001   PT Time Calculation (min) 46 min   Activity Tolerance Patient tolerated treatment well;No increased pain   Behavior During Therapy WFL for tasks assessed/performed      Past Medical History:  Diagnosis Date  . Anxiety   . Back pain   . Lumbar herniated disc     Past Surgical History:  Procedure Laterality Date  . uterine cyst      There were no vitals filed for this visit.      Subjective Assessment - 06/19/16 0920    Subjective Pt states she has been feeling a little stiff over the last few days; states she has not been walking regularly. Been busy with family duties and unable to do regular exercise program.   Pertinent History Pt with chronic hx of LBP and sciatica (approx 20 years). MRI in 2012 revealed herniated disc. Patient recieved benefit from [facility] extension based physical therapy.   Limitations Walking;House hold activities   How long can you walk comfortably? 1 mile; reports onset of LBP   Patient Stated Goals pt would like to participate in exercise program with no c/o    Currently in Pain? Yes   Pain Score 3    Pain Location Back   Pain Orientation Lower   Pain Descriptors / Indicators Constant   Pain Onset More than a month ago     Objective:    Therapeutic Exercise: Piriformis stretch 45 sec x2. Hip flexor stretch 45 sec x2. Supine TrA activation exercises including: advanced bike 30 sec x5, SLR with circles 1 minute x2 BLE, bridge 1 minute x2. TG squat with 2# weights  emphasis on core activation bicep curls 3x10, shoulder flexion 3x10, press ups 3x10.    Manual tx: Manual tx: Passive accessory mobilizations: UPA L2-L5, CPA L2-L5central sacral mobilization (2 bouts ea, . Grade II-III).    Pt response for medical necessity: Pt demonstrates mild hypomobility of lumbar spine with initial reported stiffness/ 3/10 pain. Pt responds well to gentle AROM of amb and manual tx with decrease to 0/10 pain. Good tolerance of TrA there ex with no pain; onset of muscle fatigue between 30sec-53minute.       PT Long Term Goals - 06/04/16 1237      PT LONG TERM GOAL #1   Title Pt will score <35% on Modified Oswestry to increase self percieved mobility   Baseline 8/9: 50% Severe Self Percieved Disability   Time 4   Period Weeks   Status New     PT LONG TERM GOAL #2   Title Pt will be able to touch her toes without >2/10 LBP to promote return to PLOF   Baseline pt able to tolerate fingertips to proximal knee before LBP >5/10   Time 4   Period Weeks   Status New     PT LONG TERM GOAL #3   Title Pt will be able to lift 5lbs with no <3/10 LBP with proper biomechanics/posture  Baseline pt only able to lift very light weights with 5/10 LBP   Time 4   Period Weeks   Status New     PT LONG TERM GOAL #4   Title Pt will be able to ambulate 2 miles with >3/10 LBP to promote return to regular exercise program   Baseline pt able to tolerate <1 mile; currently unable to exercise   Time 4   Period Weeks   Status New            Plan - 06/19/16 1221    Clinical Impression Statement Pt demonstrates improvement in LBP and stiffness following short bout of ambulation on treadmill. Demonstrates weakness in glutes/hamstrings when performing bridge with TrA activation with difficulty obtaining neutral spine. Continues to present with tight piriformis L>R. Overall good tolerance of core stability program but with early onset of muscle fatigue.   Rehab Potential Good    Clinical Impairments Affecting Rehab Potential Positive: Age, Previous response to therapy, Active. Negative: Chronic   PT Frequency 2x / week   PT Duration 4 weeks   PT Treatment/Interventions ADLs/Self Care Home Management;Aquatic Therapy;Cryotherapy;Electrical Stimulation;Moist Heat;Traction;Gait training;Stair training;Functional mobility training;Therapeutic activities;Therapeutic exercise;Balance training;Neuromuscular re-education;Patient/family education;Manual techniques   PT Next Visit Plan core progression, cont mckenzie extension based program.    PT Home Exercise Plan TrA activation with piriformis/hamstring stretching program   Consulted and Agree with Plan of Care Patient      Patient will benefit from skilled therapeutic intervention in order to improve the following deficits and impairments:  Decreased activity tolerance, Decreased mobility, Decreased range of motion, Decreased strength, Hypomobility, Increased muscle spasms, Impaired flexibility, Improper body mechanics, Pain  Visit Diagnosis: Difficulty in walking, not elsewhere classified  Bilateral low back pain with sciatica, sciatica laterality unspecified  Muscle weakness (generalized)  Abnormal posture     Problem List Patient Active Problem List   Diagnosis Date Noted  . Vitamin D deficiency 12/19/2015   Cammie McgeeMichael C Sherk, PT, DPT # [phone] Michaelyn BarterLaura Tajuanna Burnett, SPT 06/20/2016, 3:53 PM  [place] [facility] [address] [place], [place], [address] Phone: [phone]   Fax:  [phone]  Name: Jordan HardyMelanie Crossan MRN: [id number] Date of Birth: 01/18/1966

## 2016-06-24 ENCOUNTER — Encounter: Payer: Self-pay | Admitting: Physical Therapy

## 2016-06-24 ENCOUNTER — Ambulatory Visit: Payer: Managed Care, Other (non HMO) | Admitting: Physical Therapy

## 2016-06-24 DIAGNOSIS — M5442 Lumbago with sciatica, left side: Secondary | ICD-10-CM

## 2016-06-24 DIAGNOSIS — R293 Abnormal posture: Secondary | ICD-10-CM

## 2016-06-24 DIAGNOSIS — R262 Difficulty in walking, not elsewhere classified: Secondary | ICD-10-CM | POA: Diagnosis not present

## 2016-06-24 DIAGNOSIS — M6281 Muscle weakness (generalized): Secondary | ICD-10-CM

## 2016-06-24 DIAGNOSIS — M5441 Lumbago with sciatica, right side: Secondary | ICD-10-CM

## 2016-06-24 NOTE — Therapy (Signed)
[place] [facility] [address] [place], [place], [address] Phone: [phone]   Fax:  [phone]  Physical Therapy Treatment  Patient Details  Name: Jordan HardyMelanie Hounshell MRN: [id number] Date of Birth: 12/05/1965 Referring Provider: Jackelyn HoehnKaren Jutta Behling MD  Encounter Date: 06/24/2016      PT End of Session - 06/24/16 0914    Visit Number 6   Number of Visits 8   Date for PT Re-Evaluation 07/02/16   PT Start Time 0830   PT Stop Time 0915   PT Time Calculation (min) 45 min   Activity Tolerance Patient tolerated treatment well;No increased pain   Behavior During Therapy WFL for tasks assessed/performed      Past Medical History:  Diagnosis Date  . Anxiety   . Back pain   . Lumbar herniated disc     Past Surgical History:  Procedure Laterality Date  . uterine cyst      There were no vitals filed for this visit.      Subjective Assessment - 06/24/16 0836    Subjective Pt states that she did over 6 miles of walking in one day over the weekend. Today she reports mild fatigue in anterior thighs and stiffness in low back but no increase in back/leg pain.   Pertinent History Pt with chronic hx of LBP and sciatica (approx 20 years). MRI in 2012 revealed herniated disc. [facility]   Limitations Walking;House hold activities   How long can you walk comfortably? 1 mile; reports onset of LBP   Patient Stated Goals pt would like to participate in exercise program with no c/o    Currently in Pain? No/denies      Objective:  Therapeutic Exercise: Treadmill 1.5mph 5 minutes (warm up/no charge). Hooklying bridge with air ex pad and RTB at proximal thigh 3x10 with 10 sec holds. Bridge with alternating marching with RTB at proximal thigh 3x30. Pelvic tilt x30. Sidelying leg neutral abduction with flexion/extension #2 ankle weight 1 minute x3 ea leg. Sidelying clamshell x10 LLE. Seated on  white theraball marching x30, marching with reciprocal shoulder flexion x30, knee extension with reciprocal shoulder flexion x30. Ext on white theraball x15 sec. Encouraged performance of child's pose and cat/camel after TrA activation drills. Thoracic ext x10. Prone quadricep stretch 2 x45 sec.  Pt response for medical necessity: Pt with good tolerance of progressive TrA strengthening program with no c/o of LBP this session. She demonstrates early onset of LE fatigue with L worse than R but able to complete all therapeutic exercise. Will discuss progression towards independence with HEP next session.        PT Education - 06/24/16 0914    Education provided Yes   Education Details HEP; progressed core stability program. Abduction leg circles with #2   Person(s) Educated Patient   Methods Explanation;Demonstration;Handout   Comprehension Verbalized understanding;Returned demonstration;Verbal cues required             PT Long Term Goals - 06/04/16 1237      PT LONG TERM GOAL #1   Title Pt will score <35% on Modified Oswestry to increase self percieved mobility   Baseline 8/9: 50% Severe Self Percieved Disability   Time 4   Period Weeks   Status New     PT LONG TERM GOAL #2   Title Pt will be able to touch her toes without >2/10 LBP to promote return to PLOF   Baseline pt able  to tolerate fingertips to proximal knee before LBP >5/10   Time 4   Period Weeks   Status New     PT LONG TERM GOAL #3   Title Pt will be able to lift 5lbs with no <3/10 LBP with proper biomechanics/posture   Baseline pt only able to lift very light weights with 5/10 LBP   Time 4   Period Weeks   Status New     PT LONG TERM GOAL #4   Title Pt will be able to ambulate 2 miles with >3/10 LBP to promote return to regular exercise program   Baseline pt able to tolerate <1 mile; currently unable to exercise   Time 4   Period Weeks   Status New            Plan - 06/24/16 4098    Clinical  Impression Statement Pt demonstrates hip flexor/hip abductor weakness greater in LLE than RLE with performance of sidelying leg raise with TrA activation. Presents with mild tightness in bilateral quadriceps. She reports no pain with therapeutic exercises today; demonstrates tendency for moderate anterior tilt but responsive to verbal cueing to correct.   Rehab Potential Good   Clinical Impairments Affecting Rehab Potential Positive: Age, Previous response to therapy, Active. Negative: Chronic   PT Frequency 2x / week   PT Duration 4 weeks   PT Treatment/Interventions ADLs/Self Care Home Management;Aquatic Therapy;Cryotherapy;Electrical Stimulation;Moist Heat;Traction;Gait training;Stair training;Functional mobility training;Therapeutic activities;Therapeutic exercise;Balance training;Neuromuscular re-education;Patient/family education;Manual techniques   PT Next Visit Plan core progression, cont [facility]ckenzie extension based program.    PT Home Exercise Plan TrA activation/progressive exercise program with piriformis/hamstring stretching program   Consulted and Agree with Plan of Care Patient      Patient will benefit from skilled therapeutic intervention in order to improve the following deficits and impairments:  Decreased activity tolerance, Decreased mobility, Decreased range of motion, Decreased strength, Hypomobility, Increased muscle spasms, Impaired flexibility, Improper body mechanics, Pain  Visit Diagnosis: Difficulty in walking, not elsewhere classified  Bilateral low back pain with sciatica, sciatica laterality unspecified  Muscle weakness (generalized)  Abnormal posture     Problem List Patient Active Problem List   Diagnosis Date Noted  . Vitamin D deficiency 12/19/2015   Cammie Mcgee, PT, DPT # [id number] Michaelyn Barter, SPT 06/25/2016, 2:47 PM  [place] [address] [place], [place], [address] Phone: [phone]   Fax:   [phone]  Name: Suhana Wilner MRN: [id number] Date of Birth: [birth year]

## 2016-07-02 ENCOUNTER — Encounter: Payer: Self-pay | Admitting: Physical Therapy

## 2016-07-02 ENCOUNTER — Ambulatory Visit: Payer: Managed Care, Other (non HMO) | Attending: Pediatrics | Admitting: Physical Therapy

## 2016-07-02 DIAGNOSIS — M5442 Lumbago with sciatica, left side: Secondary | ICD-10-CM | POA: Insufficient documentation

## 2016-07-02 DIAGNOSIS — R293 Abnormal posture: Secondary | ICD-10-CM | POA: Diagnosis present

## 2016-07-02 DIAGNOSIS — R262 Difficulty in walking, not elsewhere classified: Secondary | ICD-10-CM | POA: Insufficient documentation

## 2016-07-02 DIAGNOSIS — M6281 Muscle weakness (generalized): Secondary | ICD-10-CM | POA: Diagnosis present

## 2016-07-02 DIAGNOSIS — M5441 Lumbago with sciatica, right side: Secondary | ICD-10-CM | POA: Diagnosis present

## 2016-07-02 NOTE — Therapy (Signed)
[place] [facility] [address] [place], [place], [address] Phone: [phone]   Fax:  [phone]  Physical Therapy Treatment/Discharge  Patient Details  Name: Jordan Peters MRN: [id number] Date of Birth: May 31, 1966 Referring Provider: Jackelyn Hoehn MD  Encounter Date: 07/02/2016      PT End of Session - 07/02/16 1722    Visit Number 7   Number of Visits 8   Date for PT Re-Evaluation 07/02/16   PT Start Time 1600   PT Stop Time 1644   PT Time Calculation (min) 44 min   Activity Tolerance Patient tolerated treatment well;No increased pain   Behavior During Therapy WFL for tasks assessed/performed      Past Medical History:  Diagnosis Date  . Anxiety   . Back pain   . Lumbar herniated disc     Past Surgical History:  Procedure Laterality Date  . uterine cyst      There were no vitals filed for this visit.      Subjective Assessment - 07/02/16 1720    Subjective Pt states that she has been trying to incorporate 30 minutes of exercise daily with added walking program. She reports no c/o with low back in the last week.   Pertinent History Pt with chronic hx of LBP and sciatica (approx 20 years). MRI in 2012 revealed herniated disc. Patient recieved benefit from [facility] extension based physical therapy.   Limitations Walking;House hold activities   Patient Stated Goals pt would like to participate in exercise program with no c/o    Currently in Pain? No/denies      Objective:  Therapeutic Exercise: Treadmill 10 minutes 1. (warm up/no charge). Bird dog with reciprocal UE flex/LE ext x20. Short lever bridge on theraball with no UE support 2x10. Long lever bridge on theraball with no UE support 1x10. Bridge with hamstring curl on theraball 1x10 with min A for ball control. Modified prone plank on knees 1 x 2 minutes. Prone plank 2x <10 sec. Sideplank on knees 1x30 sec. Sideplank 1x15 sec. Discussed progression of  prone/side plank with hip ext/abd respectively to increase difficulty.  Pt response for medical necessity: Pt demonstrates good understanding of progressive core stability/lumbar mobility program. She experiences no instances of low back pain this session and is comfortable with independent management of LBP sx at this time. She will f/u with PT in the future as needed.        PT Education - 07/02/16 1721    Education provided Yes   Education Details Discussed core stability program progression   Person(s) Educated Patient   Methods Explanation;Demonstration;Handout   Comprehension Verbalized understanding;Returned demonstration             PT Long Term Goals - 07/02/16 1725      PT LONG TERM GOAL #1   Title Pt will score <35% on Modified Oswestry to increase self percieved mobility   Baseline 8/9: 50% Severe Self Percieved Disability. 9/6: 2%   Time 4   Period Weeks   Status Achieved     PT LONG TERM GOAL #2   Title Pt will be able to touch her toes without >2/10 LBP to promote return to PLOF   Baseline pt able to tolerate fingertips to proximal knee before LBP >5/10   Time 4   Period Weeks   Status Achieved     PT LONG TERM GOAL #3   Title Pt will be able to lift 5lbs with no <3/10 LBP  with proper biomechanics/posture   Baseline pt only able to lift very light weights with 5/10 LBP   Time 4   Period Weeks   Status Achieved     PT LONG TERM GOAL #4   Title Pt will be able to ambulate 2 miles with >3/10 LBP to promote return to regular exercise program   Baseline pt able to tolerate <1 mile; currently unable to exercise   Time 4   Period Weeks   Status Achieved               Plan - 07/02/16 1723    Clinical Impression Statement Pt demonstrates good understanding of core progression/back mobility exercise plan and is well prepared for independent management of low back pain sx at this time. She has progressed through core stability program and is currently  able to perform prone plank for <10 sec. Pt instructed to progress core stability program independently as she masters each level of difficulty. Pt will f/u with PT as needed.   Rehab Potential Good   Clinical Impairments Affecting Rehab Potential Positive: Age, Previous response to therapy, Active. Negative: Chronic   PT Frequency 2x / week   PT Duration 4 weeks   PT Treatment/Interventions ADLs/Self Care Home Management;Aquatic Therapy;Cryotherapy;Electrical Stimulation;Moist Heat;Traction;Gait training;Stair training;Functional mobility training;Therapeutic activities;Therapeutic exercise;Balance training;Neuromuscular re-education;Patient/family education;Manual techniques   PT Home Exercise Plan TrA activation/progressive exercise program with piriformis/hamstring stretching program   Consulted and Agree with Plan of Care Patient      Patient will benefit from skilled therapeutic intervention in order to improve the following deficits and impairments:  Decreased activity tolerance, Decreased mobility, Decreased range of motion, Decreased strength, Hypomobility, Increased muscle spasms, Impaired flexibility, Improper body mechanics, Pain  Visit Diagnosis: Difficulty in walking, not elsewhere classified  Bilateral low back pain with sciatica, sciatica laterality unspecified  Muscle weakness (generalized)  Abnormal posture     Problem List Patient Active Problem List   Diagnosis Date Noted  . Vitamin D deficiency 12/19/2015   Cammie McgeeMichael C Sherk, PT, DPT # [id number] Vernona RiegerLaura Polo Mcmartin SPT 07/02/2016, 5:27 PM  [place] [facility] [address] [place], [place], [address] Phone: [phone]   Fax:  [phone]  Name: Jordan Peters MRN: [id number] Date of Birth: 09/13/1966

## 2016-11-10 IMAGING — MG MM DIGITAL SCREENING BILAT W/ CAD
4 series · 4 of 4 positions shown · non-contrast
Comparison: Previous exam(s).

CLINICAL DATA: Screening.

EXAM:
DIGITAL SCREENING BILATERAL MAMMOGRAM WITH CAD

[R MLO]
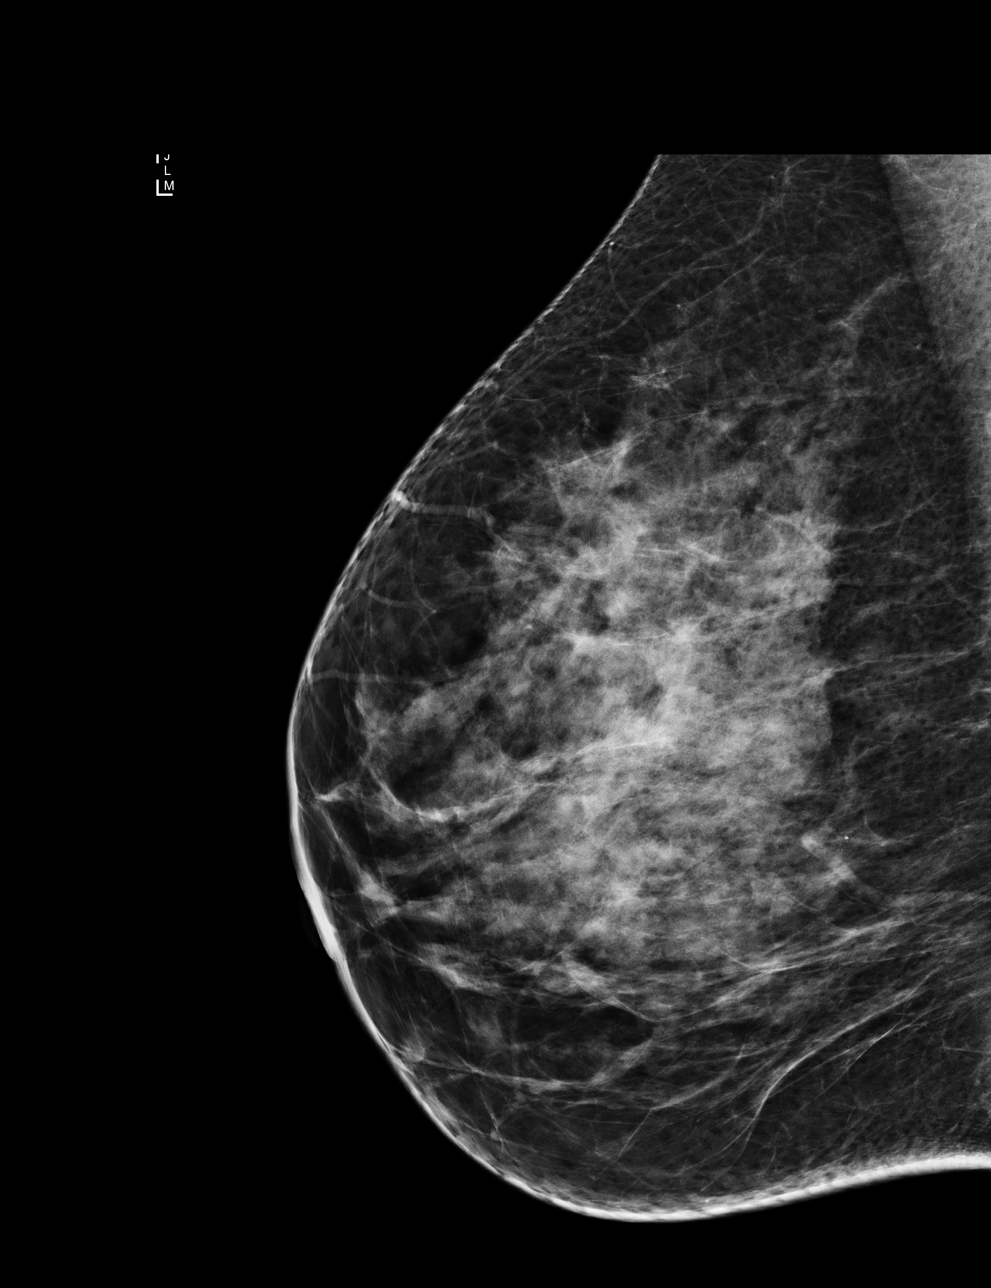

[R CC]
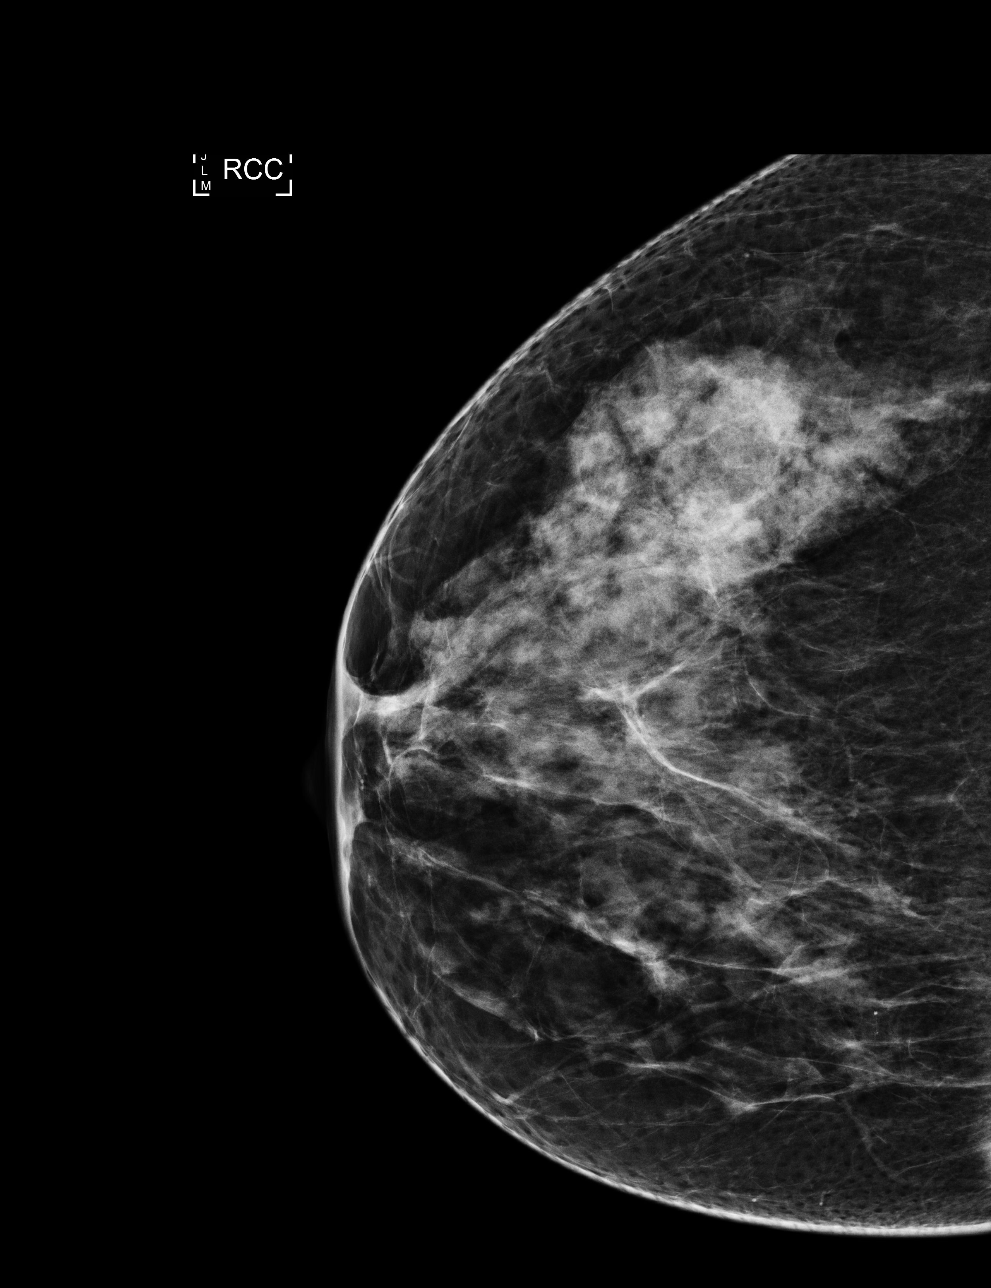

[L CC]
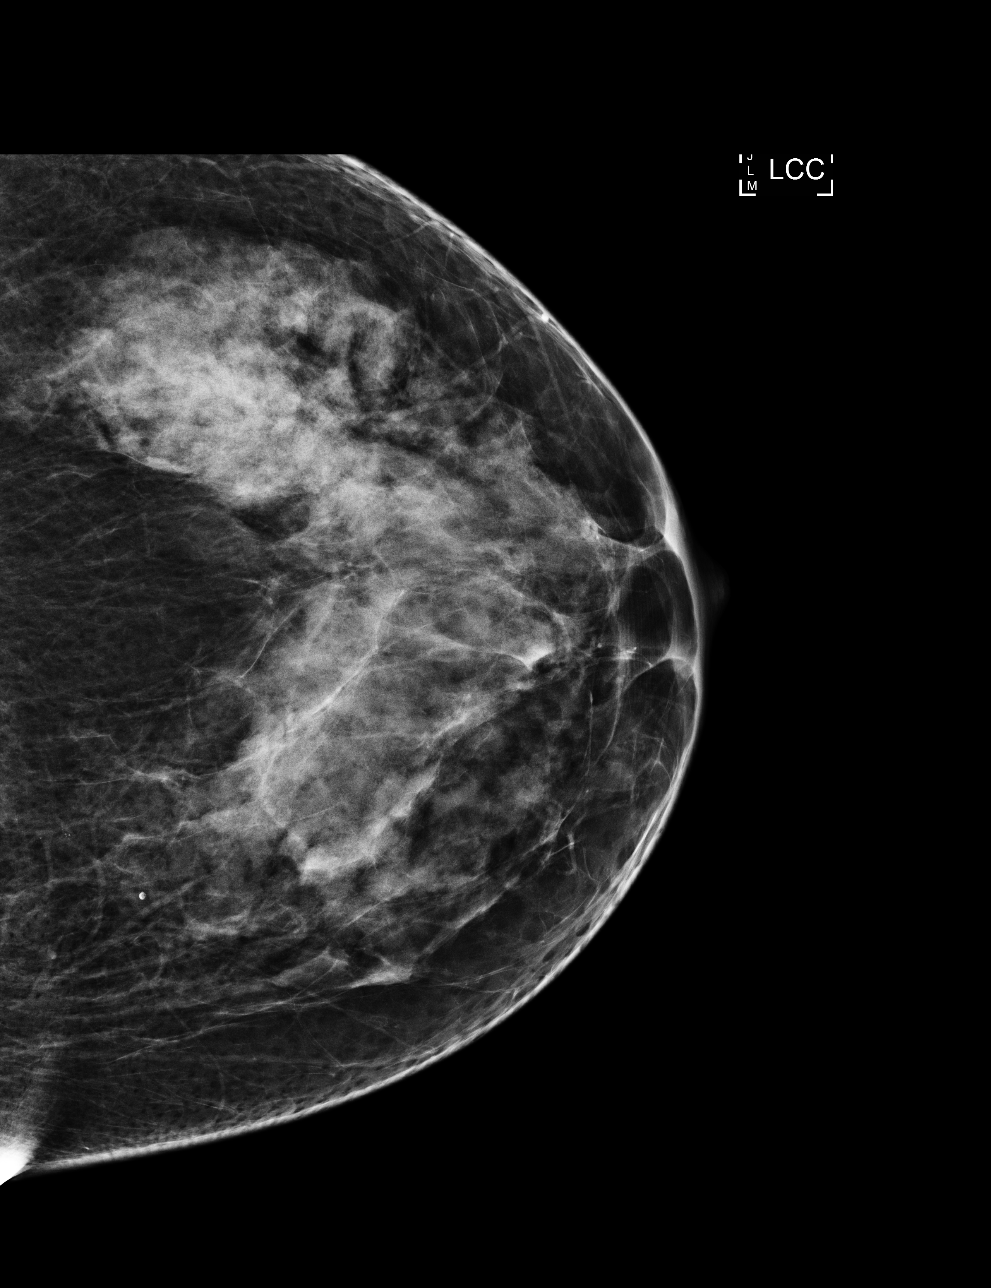

[L MLO]
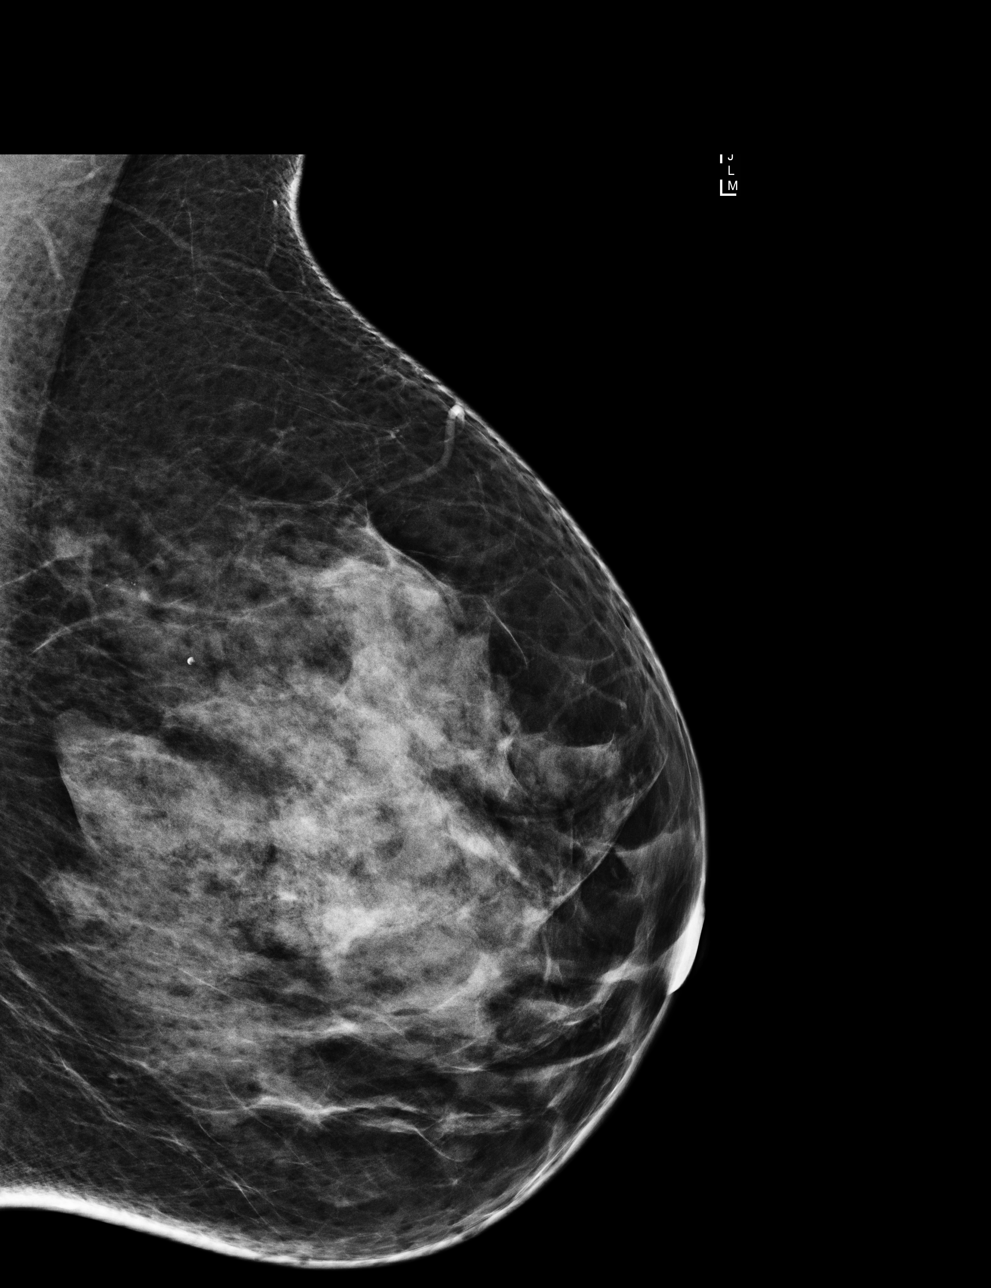

[4 of 4 positions shown; findings below may reference images not displayed]

ACR Breast Density Category c: The breast tissue is heterogeneously
dense, which may obscure small masses.
FINDINGS: There are no findings suspicious for malignancy. Images were
processed with CAD.
IMPRESSION: No mammographic evidence of malignancy. A result letter of this
screening mammogram will be mailed directly to the patient.

RECOMMENDATION:
Screening mammogram in one year. (Code:[id number])

BI-RADS CATEGORY  1: Negative.

## 2016-12-14 ENCOUNTER — Emergency Department
Admission: EM | Admit: 2016-12-14 | Discharge: 2016-12-14 | Disposition: A | Discharge status: Home / Self Care | Payer: Managed Care, Other (non HMO) | Attending: Student in an Organized Health Care Education/Training Program | Admitting: Student in an Organized Health Care Education/Training Program

## 2016-12-14 ENCOUNTER — Encounter: Payer: Self-pay | Admitting: Emergency Medicine

## 2016-12-14 DIAGNOSIS — M545 Low back pain: Secondary | ICD-10-CM | POA: Diagnosis present

## 2016-12-14 DIAGNOSIS — Z79899 Other long term (current) drug therapy: Secondary | ICD-10-CM | POA: Insufficient documentation

## 2016-12-14 DIAGNOSIS — M5441 Lumbago with sciatica, right side: Secondary | ICD-10-CM | POA: Insufficient documentation

## 2016-12-14 DIAGNOSIS — G8929 Other chronic pain: Secondary | ICD-10-CM

## 2016-12-14 MED ORDER — DEXAMETHASONE SODIUM PHOSPHATE 10 MG/ML IJ SOLN
10.0000 mg | Freq: Once | INTRAMUSCULAR | Status: AC
  Administered 2016-12-14: 10 mg via INTRAMUSCULAR
  Filled 2016-12-14: qty 1

## 2016-12-14 MED ORDER — KETOROLAC TROMETHAMINE 60 MG/2ML IM SOLN
30.0000 mg | Freq: Once | INTRAMUSCULAR | Status: AC
  Administered 2016-12-14: 30 mg via INTRAMUSCULAR
  Filled 2016-12-14: qty 2

## 2016-12-14 MED ORDER — KETOROLAC TROMETHAMINE 10 MG PO TABS: 10.0000 mg | ORAL_TABLET | Freq: Three times a day (TID) | ORAL | 0 refills | Status: DC

## 2016-12-14 MED ORDER — ORPHENADRINE CITRATE ER 100 MG PO TB12: 100.0000 mg | ORAL_TABLET | Freq: Two times a day (BID) | ORAL | 0 refills | Status: DC | PRN

## 2016-12-14 MED ORDER — PREDNISONE 10 MG PO TABS: 10.0000 mg | ORAL_TABLET | Freq: Two times a day (BID) | ORAL | 0 refills | Status: DC

## 2016-12-14 MED ORDER — ORPHENADRINE CITRATE 30 MG/ML IJ SOLN
60.0000 mg | INTRAMUSCULAR | Status: AC
  Administered 2016-12-14: 60 mg via INTRAMUSCULAR
  Filled 2016-12-14: qty 2

## 2016-12-14 NOTE — ED Provider Notes (Signed)
[facility] Emergency Department Provider Note ____________________________________________  Time seen: 1842  I have reviewed the triage vital signs and the nursing notes.  HISTORY  Chief Complaint  Back Pain and Fall  HPI Jordan Peters is a 51 y.o. female presents to the ED accompanied by herhusband for evaluation and management of severe back pain with onset this morning. Patient has history of herniated disc and lumbar spine. She describes she had initially a strain to the back when she rolled over in bed. She describes this afternoon after she was on the toilet she had a difficult time sitting up, suddenly fell to her knees. She did not have any injury sustained from the fall however. She presents now with pain that is mild in nature radiating lower back down the right leg. She denies any incontinence of bladder or bowel, foot drop, or leg weakness. She remarks that she had a previous flare a few weeks ago and had significant benefit after an IM injection of steroid in the urgent care office. She has previously been in physical therapy and seen a chiropractor for ongoing back pain. She's not had a follow-up with neurology or orthopedic spine since relocating to the area. The patient does take baclofen and hydrocodone as needed for back pain relief.  Past Medical History:  Diagnosis Date  . Anxiety   . Back pain   . Lumbar herniated disc     Patient Active Problem List   Diagnosis Date Noted  . Vitamin D deficiency 12/19/2015    Past Surgical History:  Procedure Laterality Date  . uterine cyst      Prior to Admission medications   Medication Sig Start Date End Date Taking? Authorizing Provider  baclofen (LIORESAL) 10 MG tablet Take 1 tablet (10 mg total) by mouth 3 (three) times daily. Patient not taking: Reported on 06/04/2016 05/25/16   Lutricia Feil, PA-C  clonazePAM (KLONOPIN) 0.5 MG tablet Take 1 tablet (0.5 mg total) by mouth 2 (two) times daily  as needed (muscle spasm). 05/25/16   Lutricia Feil, PA-C  cyanocobalamin (,VITAMIN B-12,) 1000 MCG/ML injection Inject 1 mL (1,000 mcg total) into the muscle once. 04/24/16   Melody N Shambley, CNM  diclofenac (CATAFLAM) 50 MG tablet Take 50 mg by mouth 3 (three) times daily.    Historical Provider, MD  FLUoxetine (PROZAC) 10 MG tablet Take 10 mg by mouth daily. Reported on 01/31/2016    Historical Provider, MD  fluticasone (FLONASE) 50 MCG/ACT nasal spray Place into both nostrils daily.    Historical Provider, MD  HYDROcodone-acetaminophen (NORCO/VICODIN) 5-325 MG tablet Take 1 tablet by mouth every 6 (six) hours as needed for moderate pain.    Historical Provider, MD  ketorolac (TORADOL) 10 MG tablet Take 1 tablet (10 mg total) by mouth every 8 (eight) hours. 12/14/16   Rayshawn Maney V Bacon Znya Albino, PA-C  naproxen (NAPROSYN) 500 MG tablet Take 1 tablet (500 mg total) by mouth 2 (two) times daily with a meal. Patient not taking: Reported on 06/04/2016 05/25/16   Lutricia Feil, PA-C  orphenadrine (NORFLEX) 100 MG tablet Take 1 tablet (100 mg total) by mouth 2 (two) times daily as needed for muscle spasms. 12/14/16   Latasha Buczkowski V Bacon Ernst Cumpston, PA-C  phentermine (ADIPEX-P) 37.5 MG tablet Take 1 tablet (37.5 mg total) by mouth daily before breakfast. Patient not taking: Reported on 06/04/2016 04/24/16   Melody N Shambley, CNM  predniSONE (DELTASONE) 10 MG tablet Take 1 tablet (10 mg total) by  mouth 2 (two) times daily with a meal. 12/14/16   Brazos Sandoval V Bacon Loula Marcella, PA-C  Vitamin D, Ergocalciferol, (DRISDOL) 50000 units CAPS capsule Take 1 capsule (50,000 Units total) by mouth 2 (two) times a week. 12/19/15   Melody N Shambley, CNM    Allergies Sulfa antibiotics  Family History  Problem Relation Age of Onset  . Breast cancer Paternal Aunt   . Hypertension Father   . Cancer Father     bladder    Social History Social History  Substance Use Topics  . Smoking status: Never Smoker  . Smokeless tobacco: Never  Used  . Alcohol use Yes     Comment: social    Review of Systems  Constitutional: Negative for fever. Cardiovascular: Negative for chest pain. Respiratory: Negative for shortness of breath. Gastrointestinal: Negative for abdominal pain, vomiting and diarrhea. Genitourinary: Negative for dysuria. Musculoskeletal: Positive for back pain. Skin: Negative for rash. Neurological: Negative for headaches, focal weakness or numbness. ____________________________________________  PHYSICAL EXAM:  VITAL SIGNS: ED Triage Vitals  Enc Vitals Group     BP 12/14/16 1757 (!) 111/98     Pulse Rate 12/14/16 1757 90     Resp 12/14/16 1757 (!) 22     Temp 12/14/16 1757 98.1 F (36.7 C)     Temp Source 12/14/16 1757 Oral     SpO2 12/14/16 1757 100 %     Weight 12/14/16 1757 160 lb (72.6 kg)     Height 12/14/16 1757 5' (1.524 m)     Head Circumference --      Peak Flow --      Pain Score 12/14/16 1803 10     Pain Loc --      Pain Edu? --      Excl. in GC? --     Constitutional: Alert and oriented. Well appearing and in no distress. Head: Normocephalic and atraumatic. Cardiovascular: Normal rate, regular rhythm. Normal distal pulses. Respiratory: Normal respiratory effort. No wheezes/rales/rhonchi. Gastrointestinal: Soft and nontender. No distention. Musculoskeletal: Normal spinal alignment without midline tenderness, spasm, deformity, step-off. Patient with slow transition from supine to sit. 7 demonstrated a negative seated straight leg raise. She has normal hip flexion and rotation bilaterally. Nontender with normal range of motion in all extremities.  Neurologic:  Normal LE DTRs bilaterally. Normal toe dorsiflexion and foot eversion noted. Normal speech and language. No gross focal neurologic deficits are appreciated. Skin:  Skin is warm, dry and intact. No rash noted. ____________________________________________  PROCEDURES  Toradol 30 mg IM Norflex 60 mg IM Decadron 10 mg  IM ____________________________________________  INITIAL IMPRESSION / ASSESSMENT AND PLAN / ED COURSE  Patient with an acute flare of her chronic ongoing low back pain with radicular symptoms. She has responded to IM medicines in the ED. She'll be discharged at this time with prescriptions for Toradol, Norflex, and prednisone. She will follow-up with ortho-spine for definitive management of her symptoms. ____________________________________________  FINAL CLINICAL IMPRESSION(S) / ED DIAGNOSES  Final diagnoses:  Chronic right-sided low back pain with right-sided sciatica      Lissa HoardJenise V Bacon Jalyne Brodzinski, PA-C 12/19/16 0009    Willy EddyPatrick Robinson, MD 12/19/16 1827

## 2016-12-14 NOTE — ED Notes (Signed)
NAD noted at time of D/C. Pt denies questions or concerns. Pt taken to the lobby via wheelchair at this time.  

## 2016-12-14 NOTE — Discharge Instructions (Signed)
Take the prescription meds as directed. Apply ice and heat to reduce symptoms. Follow-up with your provider or one of the orthopedic providers listed above. Return to the ED as discussed.

## 2016-12-14 NOTE — ED Triage Notes (Signed)
Pt states that she started having severe back pain this morning, pt has hx/o herniated disc. Pt was trying to get up off the toilet and fell. Pt states that she did not hurt anything when she fell. Pt states that she has mild pain radiating down the right leg.

## 2016-12-19 ENCOUNTER — Encounter: Payer: Medicaid Other | Admitting: Obstetrics and Gynecology

## 2017-01-01 ENCOUNTER — Other Ambulatory Visit: Payer: Self-pay | Admitting: Family Medicine

## 2017-01-01 DIAGNOSIS — Z1231 Encounter for screening mammogram for malignant neoplasm of breast: Secondary | ICD-10-CM

## 2017-01-02 ENCOUNTER — Other Ambulatory Visit: Payer: Self-pay | Admitting: Obstetrics and Gynecology

## 2017-01-02 ENCOUNTER — Other Ambulatory Visit: Payer: Self-pay | Admitting: Family Medicine

## 2017-01-02 DIAGNOSIS — N852 Hypertrophy of uterus: Secondary | ICD-10-CM

## 2017-01-06 ENCOUNTER — Ambulatory Visit: Payer: Managed Care, Other (non HMO)

## 2017-01-08 ENCOUNTER — Ambulatory Visit: Admission: RE | Admit: 2017-01-08 | Payer: Managed Care, Other (non HMO) | Source: Ambulatory Visit

## 2017-01-09 ENCOUNTER — Encounter: Payer: Medicaid Other | Admitting: Certified Nurse Midwife

## 2017-01-12 ENCOUNTER — Ambulatory Visit
Admission: RE | Admit: 2017-01-12 | Discharge: 2017-01-12 | Disposition: A | Discharge status: Home / Self Care | Payer: Managed Care, Other (non HMO) | Source: Ambulatory Visit | Attending: Family Medicine | Admitting: Family Medicine

## 2017-01-12 DIAGNOSIS — N852 Hypertrophy of uterus: Secondary | ICD-10-CM | POA: Diagnosis present

## 2017-01-13 ENCOUNTER — Ambulatory Visit
Admission: RE | Admit: 2017-01-13 | Discharge: 2017-01-13 | Disposition: A | Discharge status: Home / Self Care | Payer: Managed Care, Other (non HMO) | Source: Ambulatory Visit | Attending: Family Medicine | Admitting: Family Medicine

## 2017-01-13 DIAGNOSIS — Z1231 Encounter for screening mammogram for malignant neoplasm of breast: Secondary | ICD-10-CM | POA: Diagnosis not present

## 2017-01-16 ENCOUNTER — Encounter: Payer: Self-pay | Admitting: Certified Nurse Midwife

## 2017-01-16 ENCOUNTER — Ambulatory Visit (INDEPENDENT_AMBULATORY_CARE_PROVIDER_SITE_OTHER): Payer: Managed Care, Other (non HMO) | Admitting: Certified Nurse Midwife

## 2017-01-16 VITALS — BP 114/77 | HR 80 | Ht 60.0 in | Wt 164.4 lb

## 2017-01-16 DIAGNOSIS — E669 Obesity, unspecified: Secondary | ICD-10-CM

## 2017-01-16 DIAGNOSIS — Z01419 Encounter for gynecological examination (general) (routine) without abnormal findings: Secondary | ICD-10-CM

## 2017-01-16 NOTE — Progress Notes (Signed)
ANNUAL PREVENTATIVE CARE GYN  ENCOUNTER NOTE  Subjective:       Jordan Peters is a 51 y.o. G2P0 female here for a routine annual gynecologic exam.  She has no current GYN concerns.   She does have a "slipped disc" that flares up from time, but this is being managed by her PCP.   Her cycles occur approximately every 23 days, last four (4) days with a moderate flow that is controlled by pads.   Denies difficulty breathing or respiratory distress, chest pain, abdominal pain, unexplained vaginal bleeding, and leg pain or swelling.    Gynecologic History  Patient's last menstrual period was 01/06/2017 (exact date).   Contraception: vasectomy   Last Pap: 12/18/2015. Results were: normal  Last mammogram: 01/13/2017. Results were: normal  Obstetric History OB History  Gravida Para Term Preterm AB Living  2         2  SAB TAB Ectopic Multiple Live Births          2    # Outcome Date GA Lbr Len/2nd Weight Sex Delivery Anes PTL Lv  2 Gravida 2001    F Vag-Spont   LIV  1 Gravida 1998    F Vag-Spont   LIV      Past Medical History:  Diagnosis Date  . Anxiety   . Back pain   . Lumbar herniated disc     Past Surgical History:  Procedure Laterality Date  . uterine cyst      Current Outpatient Prescriptions on File Prior to Visit  Medication Sig Dispense Refill  . clonazePAM (KLONOPIN) 0.5 MG tablet Take 1 tablet (0.5 mg total) by mouth 2 (two) times daily as needed (muscle spasm). 20 tablet 0  . fluticasone (FLONASE) 50 MCG/ACT nasal spray Place into both nostrils daily.    . naproxen (NAPROSYN) 500 MG tablet Take 1 tablet (500 mg total) by mouth 2 (two) times daily with a meal. 60 tablet 0  . Vitamin D, Ergocalciferol, (DRISDOL) 50000 units CAPS capsule TAKE 1 CAPSULE (50,000 UNITS TOTAL) BY MOUTH 2 (TWO) TIMES A WEEK. 30 capsule 1  . baclofen (LIORESAL) 10 MG tablet Take 1 tablet (10 mg total) by mouth 3 (three) times daily. (Patient not taking: Reported on 06/04/2016) 30  each 0   Current Facility-Administered Medications on File Prior to Visit  Medication Dose Route Frequency Provider Last Rate Last Dose  . cyanocobalamin ((VITAMIN B-12)) injection 1,000 mcg  1,000 mcg Intramuscular Once Melody N Shambley, CNM        Allergies  Allergen Reactions  . Sulfa Antibiotics Rash    Social History   Social History  . Marital status: Married    Spouse name: N/A  . Number of children: N/A  . Years of education: N/A   Occupational History  . Not on file.   Social History Main Topics  . Smoking status: Never Smoker  . Smokeless tobacco: Never Used  . Alcohol use Yes     Comment: social  . Drug use: No  . Sexual activity: Yes     Comment: husband-vasectomy   Other Topics Concern  . Not on file   Social History Narrative  . No narrative on file    Family History  Problem Relation Age of Onset  . Breast cancer Paternal Aunt   . Hypertension Father   . Cancer Father     bladder    The following portions of the patient's history were reviewed and updated as appropriate:  allergies, current medications, past family history, past medical history, past social history, past surgical history and problem list.  Review of Systems  ROS negative except as noted above. Information obtained from patient.    Objective:   BP 114/77   Pulse 80   Ht 5' (1.524 m)   Wt 164 lb 7 oz (74.6 kg)   LMP 01/06/2017 (Exact Date)   BMI 32.11 kg/m    CONSTITUTIONAL: Well-developed, well-nourished female in no acute distress.   PSYCHIATRIC: Normal mood and affect. Normal behavior. Normal judgment and thought content.  NEUROLGIC: Alert and oriented to person, place, and time. Normal muscle tone coordination. No cranial nerve deficit noted.  HENT:  Normocephalic, atraumatic, External right and left ear normal. Oropharynx is clear and moist  EYES: Conjunctivae and EOM are normal. Pupils are equal, round, and reactive to light. No scleral icterus.   NECK:  Normal range of motion, supple, no masses.  Normal thyroid.   SKIN: Skin is warm and dry. No rash noted. Not diaphoretic. No erythema. No pallor.  CARDIOVASCULAR: Normal heart rate noted, regular rhythm, no murmur.  RESPIRATORY: Clear to auscultation bilaterally. Effort and breath sounds normal, no problems with respiration noted.  BREASTS: Symmetric in size. No masses, skin changes, nipple drainage, or lymphadenopathy.  ABDOMEN: Soft, normal bowel sounds, no distention noted.  No tenderness, rebound or guarding.   PELVIC:  External Genitalia: Normal  Vagina: Normal  Cervix: Normal  Uterus: Normal  Adnexa: Normal  MUSCULOSKELETAL: Normal range of motion. No tenderness.  No cyanosis, clubbing, or edema.  2+ distal pulses.  LYMPHATIC: No Axillary, Supraclavicular, or Inguinal Adenopathy.  Assessment:   Annual gynecologic examination 51 y.o.   Contraception: vasectomy   Obesity 1 Problem List Items Addressed This Visit    None    Visit Diagnoses    Encounter for gynecological examination without abnormal finding    -  Primary   Relevant Orders   CBC   Comprehensive metabolic panel   TSH   Vitamin D 1,25 dihydroxy   Lipid panel   Hemoglobin A1c   Obesity (BMI 30-39.9)       Relevant Orders   TSH   Lipid panel   Hemoglobin A1c      Plan:   Pap: Pap Co Test  Mammogram: Not Indicated  Labs: Lipid 1, TSH, Hemoglobin A1C and Vit D Level""CBC, CMP, See orders  Routine preventative health maintenance measures emphasized: Exercise/Diet/Weight control, Alcohol/Substance use risks and Stress Management  Return to Clinic - 1 Year or sooner if needed   Gunnar BullaJenkins Michelle Kenney Going, CNM

## 2017-01-16 NOTE — Patient Instructions (Signed)
Preventive Care 40-64 Years, Female Preventive care refers to lifestyle choices and visits with your health care provider that can promote health and wellness. What does preventive care include?  A yearly physical exam. This is also called an annual well check.  Dental exams once or twice a year.  Routine eye exams. Ask your health care provider how often you should have your eyes checked.  Personal lifestyle choices, including:  Daily care of your teeth and gums.  Regular physical activity.  Eating a healthy diet.  Avoiding tobacco and drug use.  Limiting alcohol use.  Practicing safe sex.  Taking low-dose aspirin daily starting at age 70.  Taking vitamin and mineral supplements as recommended by your health care provider. What happens during an annual well check? The services and screenings done by your health care provider during your annual well check will depend on your age, overall health, lifestyle risk factors, and family history of disease. Counseling  Your health care provider may ask you questions about your:  Alcohol use.  Tobacco use.  Drug use.  Emotional well-being.  Home and relationship well-being.  Sexual activity.  Eating habits.  Work and work Statistician.  Method of birth control.  Menstrual cycle.  Pregnancy history. Screening  You may have the following tests or measurements:  Height, weight, and BMI.  Blood pressure.  Lipid and cholesterol levels. These may be checked every 5 years, or more frequently if you are over 81 years old.  Skin check.  Lung cancer screening. You may have this screening every year starting at age 60 if you have a 30-pack-year history of smoking and currently smoke or have quit within the past 15 years.  Fecal occult blood test (FOBT) of the stool. You may have this test every year starting at age 86.  Flexible sigmoidoscopy or colonoscopy. You may have a sigmoidoscopy every 5 years or a colonoscopy  every 10 years starting at age 84.  Hepatitis C blood test.  Hepatitis B blood test.  Sexually transmitted disease (STD) testing.  Diabetes screening. This is done by checking your blood sugar (glucose) after you have not eaten for a while (fasting). You may have this done every 1-3 years.  Mammogram. This may be done every 1-2 years. Talk to your health care provider about when you should start having regular mammograms. This may depend on whether you have a family history of breast cancer.  BRCA-related cancer screening. This may be done if you have a family history of breast, ovarian, tubal, or peritoneal cancers.  Pelvic exam and Pap test. This may be done every 3 years starting at age 57. Starting at age 11, this may be done every 5 years if you have a Pap test in combination with an HPV test.  Bone density scan. This is done to screen for osteoporosis. You may have this scan if you are at high risk for osteoporosis. Discuss your test results, treatment options, and if necessary, the need for more tests with your health care provider. Vaccines  Your health care provider may recommend certain vaccines, such as:  Influenza vaccine. This is recommended every year.  Tetanus, diphtheria, and acellular pertussis (Tdap, Td) vaccine. You may need a Td booster every 10 years.  Varicella vaccine. You may need this if you have not been vaccinated.  Zoster vaccine. You may need this after age 69.  Measles, mumps, and rubella (MMR) vaccine. You may need at least one dose of MMR if you were born  in 1957 or later. You may also need a second dose.  Pneumococcal 13-valent conjugate (PCV13) vaccine. You may need this if you have certain conditions and were not previously vaccinated.  Pneumococcal polysaccharide (PPSV23) vaccine. You may need one or two doses if you smoke cigarettes or if you have certain conditions.  Meningococcal vaccine. You may need this if you have certain  conditions.  Hepatitis A vaccine. You may need this if you have certain conditions or if you travel or work in places where you may be exposed to hepatitis A.  Hepatitis B vaccine. You may need this if you have certain conditions or if you travel or work in places where you may be exposed to hepatitis B.  Haemophilus influenzae type b (Hib) vaccine. You may need this if you have certain conditions. Talk to your health care provider about which screenings and vaccines you need and how often you need them. This information is not intended to replace advice given to you by your health care provider. Make sure you discuss any questions you have with your health care provider. Document Released: 11/09/2015 Document Revised: 07/02/2016 Document Reviewed: 08/14/2015 Elsevier Interactive Patient Education  2017 [employer].

## 2017-01-16 NOTE — Progress Notes (Signed)
Pt is here for her pap smear. Had mammogram 3/18 and was neg.Sister had precancerous cells in uterus- had hysterectomy.

## 2017-01-19 ENCOUNTER — Encounter: Payer: Medicaid Other | Admitting: Certified Nurse Midwife

## 2017-01-19 ENCOUNTER — Other Ambulatory Visit: Payer: Managed Care, Other (non HMO)

## 2017-01-19 DIAGNOSIS — E669 Obesity, unspecified: Secondary | ICD-10-CM

## 2017-01-19 DIAGNOSIS — Z01419 Encounter for gynecological examination (general) (routine) without abnormal findings: Secondary | ICD-10-CM

## 2017-01-20 LAB — PAP IG AND HPV HIGH-RISK
HPV, high-risk: NEGATIVE
PAP SMEAR COMMENT: 0

## 2017-01-22 ENCOUNTER — Encounter: Payer: Self-pay | Admitting: Certified Nurse Midwife

## 2017-01-25 LAB — CBC
Hematocrit: 40.4 % (ref 34.0–46.6)
Hemoglobin: 13.7 g/dL (ref 11.1–15.9)
MCH: 28.8 pg (ref 26.6–33.0)
MCHC: 33.9 g/dL (ref 31.5–35.7)
MCV: 85 fL (ref 79–97)
PLATELETS: 392 10*3/uL — AB (ref 150–379)
RBC: 4.76 x10E6/uL (ref 3.77–5.28)
RDW: 14.4 % (ref 12.3–15.4)
WBC: 8.1 10*3/uL (ref 3.4–10.8)

## 2017-01-25 LAB — COMPREHENSIVE METABOLIC PANEL
ALT: 23 IU/L (ref 0–32)
AST: 19 IU/L (ref 0–40)
Albumin/Globulin Ratio: 1.7 (ref 1.2–2.2)
Albumin: 4.3 g/dL (ref 3.5–5.5)
Alkaline Phosphatase: 64 IU/L (ref 39–117)
BUN / CREAT RATIO: 22 (ref 9–23)
BUN: 14 mg/dL (ref 6–24)
Bilirubin Total: 0.3 mg/dL (ref 0.0–1.2)
CALCIUM: 9.4 mg/dL (ref 8.7–10.2)
CO2: 19 mmol/L (ref 18–29)
Chloride: 103 mmol/L (ref 96–106)
Creatinine, Ser: 0.64 mg/dL (ref 0.57–1.00)
GFR calc Af Amer: 122 mL/min/{1.73_m2} (ref >59)
GFR calc non Af Amer: 106 mL/min/{1.73_m2} (ref >59)
Globulin, Total: 2.5 g/dL (ref 1.5–4.5)
Glucose: 80 mg/dL (ref 65–99)
POTASSIUM: 4.6 mmol/L (ref 3.5–5.2)
SODIUM: 140 mmol/L (ref 134–144)
Total Protein: 6.8 g/dL (ref 6.0–8.5)

## 2017-01-25 LAB — HEMOGLOBIN A1C
Est. average glucose Bld gHb Est-mCnc: 111 mg/dL
Hgb A1c MFr Bld: 5.5 % (ref 4.8–5.6)

## 2017-01-25 LAB — LIPID PANEL
CHOL/HDL RATIO: 3.6 ratio (ref 0.0–4.4)
CHOLESTEROL TOTAL: 238 mg/dL — AB (ref 100–199)
HDL: 66 mg/dL (ref >39)
LDL CALC: 146 mg/dL — AB (ref 0–99)
Triglycerides: 128 mg/dL (ref 0–149)
VLDL CHOLESTEROL CAL: 26 mg/dL (ref 5–40)

## 2017-01-25 LAB — TSH: TSH: 2.04 u[IU]/mL (ref 0.450–4.500)

## 2017-01-25 LAB — VITAMIN D 1,25 DIHYDROXY
VITAMIN D 1, 25 (OH) TOTAL: 52 pg/mL
Vitamin D2 1, 25 (OH)2: 37 pg/mL
Vitamin D3 1, 25 (OH)2: 15 pg/mL

## 2017-02-12 ENCOUNTER — Encounter
Admission: RE | Admit: 2017-02-12 | Discharge: 2017-02-12 | Disposition: A | Discharge status: Home / Self Care | Payer: Managed Care, Other (non HMO) | Source: Ambulatory Visit | Attending: Neurosurgery | Admitting: Neurosurgery

## 2017-02-12 ENCOUNTER — Ambulatory Visit
Admission: RE | Admit: 2017-02-12 | Discharge: 2017-02-12 | Disposition: A | Discharge status: Home / Self Care | Payer: Managed Care, Other (non HMO) | Source: Ambulatory Visit | Attending: Neurosurgery | Admitting: Neurosurgery

## 2017-02-12 DIAGNOSIS — M5416 Radiculopathy, lumbar region: Secondary | ICD-10-CM | POA: Insufficient documentation

## 2017-02-12 DIAGNOSIS — Z01812 Encounter for preprocedural laboratory examination: Secondary | ICD-10-CM | POA: Insufficient documentation

## 2017-02-12 DIAGNOSIS — Z01818 Encounter for other preprocedural examination: Secondary | ICD-10-CM

## 2017-02-12 DIAGNOSIS — R9431 Abnormal electrocardiogram [ECG] [EKG]: Secondary | ICD-10-CM | POA: Diagnosis not present

## 2017-02-12 DIAGNOSIS — Z01810 Encounter for preprocedural cardiovascular examination: Secondary | ICD-10-CM | POA: Diagnosis present

## 2017-02-12 HISTORY — DX: Tremor, unspecified: R25.1

## 2017-02-12 HISTORY — DX: Headache: R51

## 2017-02-12 HISTORY — DX: Headache, unspecified: R51.9

## 2017-02-12 HISTORY — DX: Irritable bowel syndrome, unspecified: K58.9

## 2017-02-12 HISTORY — DX: Gastro-esophageal reflux disease without esophagitis: K21.9

## 2017-02-12 LAB — URINALYSIS, COMPLETE (UACMP) WITH MICROSCOPIC
BACTERIA UA: NONE SEEN
BILIRUBIN URINE: NEGATIVE
Glucose, UA: NEGATIVE mg/dL
HGB URINE DIPSTICK: NEGATIVE
KETONES UR: NEGATIVE mg/dL
Nitrite: NEGATIVE
PROTEIN: NEGATIVE mg/dL
Specific Gravity, Urine: 1.021 (ref 1.005–1.030)
pH: 5 (ref 5.0–8.0)

## 2017-02-12 LAB — CBC
HCT: 38.5 % (ref 35.0–47.0)
Hemoglobin: 13 g/dL (ref 12.0–16.0)
MCH: 28.5 pg (ref 26.0–34.0)
MCHC: 33.8 g/dL (ref 32.0–36.0)
MCV: 84.2 fL (ref 80.0–100.0)
Platelets: 339 K/uL (ref 150–440)
RBC: 4.57 MIL/uL (ref 3.80–5.20)
RDW: 13.9 % (ref 11.5–14.5)
WBC: 11 K/uL (ref 3.6–11.0)

## 2017-02-12 LAB — BASIC METABOLIC PANEL WITH GFR
Anion gap: 7 (ref 5–15)
BUN: 12 mg/dL (ref 6–20)
CO2: 25 mmol/L (ref 22–32)
Calcium: 9.4 mg/dL (ref 8.9–10.3)
Chloride: 106 mmol/L (ref 101–111)
Creatinine, Ser: 0.67 mg/dL (ref 0.44–1.00)
GFR calc Af Amer: >60 mL/min (ref >60)
GFR calc non Af Amer: >60 mL/min (ref >60)
Glucose, Bld: 75 mg/dL (ref 65–99)
Potassium: 3.2 mmol/L — ABNORMAL LOW (ref 3.5–5.1)
Sodium: 138 mmol/L (ref 135–145)

## 2017-02-12 LAB — DIFFERENTIAL
Basophils Absolute: 0 K/uL (ref 0–0.1)
Basophils Relative: 0 %
Eosinophils Absolute: 0.1 K/uL (ref 0–0.7)
Eosinophils Relative: 1 %
Lymphocytes Relative: 29 %
Lymphs Abs: 3.2 K/uL (ref 1.0–3.6)
Monocytes Absolute: 1 K/uL — ABNORMAL HIGH (ref 0.2–0.9)
Monocytes Relative: 9 %
Neutro Abs: 6.8 K/uL — ABNORMAL HIGH (ref 1.4–6.5)
Neutrophils Relative %: 61 %

## 2017-02-12 LAB — SURGICAL PCR SCREEN
MRSA, PCR: NEGATIVE
Staphylococcus aureus: NEGATIVE

## 2017-02-12 LAB — PROTIME-INR
INR: 1.1
Prothrombin Time: 14.2 s (ref 11.4–15.2)

## 2017-02-12 LAB — APTT: aPTT: 26 seconds (ref 24–36)

## 2017-02-12 NOTE — Patient Instructions (Signed)
Your procedure is scheduled on: Feb 25, 2017 (Wednesday) Report to Same Day Surgery 2nd floor medical mall [facility] Entrance-take elevator on left to 2nd floor.  Check in with surgery information desk.) To find out your arrival time please call [phone] between 1PM - 3PM on Feb 24, 2017 (Tuesday)  Remember: Instructions that are not followed completely may result in serious medical risk, up to and including death, or upon the discretion of your surgeon and anesthesiologist your surgery may need to be rescheduled.    _x___ 1. Do not eat food or drink liquids after midnight. No gum chewing or  hard candies                          __x__ 2. No Alcohol for 24 hours before or after surgery.   __x__3. No Smoking for 24 prior to surgery.   ____  4. Bring all medications with you on the day of surgery if instructed.    __x__ 5. Notify your doctor if there is any change in your medical condition     (cold, fever, infections).     Do not wear jewelry, make-up, hairpins, clips or nail polish.  Do not wear lotions, powders, or perfumes.  Do not shave 48 hours prior to surgery. Men may shave face and neck.  Do not bring valuables to the hospital.    [facility] is not responsible for any belongings or valuables.               Contacts, dentures or bridgework may not be worn into surgery.  Leave your suitcase in the car. After surgery it may be brought to your room.  For patients admitted to the hospital, discharge time is determined by your treatment team                       Patients discharged the day of surgery will not be allowed to drive home.  You will need someone to drive you home and stay with you the night of your procedure.    Please read over the following fact sheets that you were given:   [facility] Preparing for Surgery and or MRSA Information   _x___ Take anti-hypertensive (unless it includes a diuretic), cardiac, seizure, asthma,     anti-reflux and psychiatric  medicines. These include:  1. Clonazepam  2.  3.  4.  5.  6.  ____Fleets enema or Magnesium Citrate as directed.   _x___ Use CHG Soap or sage wipes as directed on instruction sheet   ____ Use inhalers on the day of surgery and bring to hospital day of surgery  ____ Stop Metformin and Janumet 2 days prior to surgery.    ____ Take 1/2 of usual insulin dose the night before surgery and none on the morning     surgery.   _x___ Follow recommendations from Cardiologist, Pulmonologist or PCP regarding          stopping Aspirin, Coumadin, Pllavix ,Eliquis, Effient, or Pradaxa, and Pletal.  X____Stop Anti-inflammatories such as Advil, Aleve, Ibuprofen, Motrin, Naproxen, Naprosyn, Goodies powders or aspirin products. OK to take Tylenol                           _x___ Stop supplements until after surgery.  But may continue Vitamin D, Vitamin B, and multivitamin      (Stop Melatonin and Vitamin C  now)  ____ Bring C-Pap to the hospital.

## 2017-02-12 NOTE — Pre-Procedure Instructions (Signed)
Urinalysis results sent to Dr. Myer Haff for review.

## 2017-02-12 NOTE — Pre-Procedure Instructions (Signed)
EKG REVIEWED BY DR PENWARDEN AND OK 

## 2017-02-13 DIAGNOSIS — Z01818 Encounter for other preprocedural examination: Secondary | ICD-10-CM | POA: Diagnosis not present

## 2017-02-13 NOTE — Progress Notes (Signed)
Cefazolin Per Pharmacy Consult for Surgical Prophylaxis:  51 yo F for surgery on 02/25/17.  Patient weight= 73.9 kg  Will order Cefazolin 2 gram IV x 1 oncall, pre-op.  For Weight < 120 kg  Bari Mantis PharmD Clinical Pharmacist 02/13/2017

## 2017-02-16 ENCOUNTER — Other Ambulatory Visit: Payer: Managed Care, Other (non HMO)

## 2017-02-24 MED ORDER — GENTAMICIN SULFATE 40 MG/ML IJ SOLN
240.0000 mg | Freq: Once | INTRAVENOUS | Status: AC
  Administered 2017-02-25: 240 mg via INTRAVENOUS
  Filled 2017-02-24: qty 6

## 2017-02-25 ENCOUNTER — Ambulatory Visit: Payer: Managed Care, Other (non HMO) | Admitting: Anesthesiology

## 2017-02-25 ENCOUNTER — Encounter
Admission: RE | Disposition: A | Discharge status: Home / Self Care | Payer: Self-pay | Source: Ambulatory Visit | Attending: Neurosurgery

## 2017-02-25 ENCOUNTER — Ambulatory Visit: Payer: Managed Care, Other (non HMO)

## 2017-02-25 ENCOUNTER — Ambulatory Visit
Admission: RE | Admit: 2017-02-25 | Discharge: 2017-02-25 | Disposition: A | Discharge status: Home / Self Care | Payer: Managed Care, Other (non HMO) | Source: Ambulatory Visit | Attending: Neurosurgery | Admitting: Neurosurgery

## 2017-02-25 ENCOUNTER — Encounter: Payer: Self-pay | Admitting: *Deleted

## 2017-02-25 DIAGNOSIS — Z8371 Family history of colonic polyps: Secondary | ICD-10-CM | POA: Insufficient documentation

## 2017-02-25 DIAGNOSIS — Z79899 Other long term (current) drug therapy: Secondary | ICD-10-CM | POA: Diagnosis not present

## 2017-02-25 DIAGNOSIS — Z8249 Family history of ischemic heart disease and other diseases of the circulatory system: Secondary | ICD-10-CM | POA: Insufficient documentation

## 2017-02-25 DIAGNOSIS — F419 Anxiety disorder, unspecified: Secondary | ICD-10-CM | POA: Insufficient documentation

## 2017-02-25 DIAGNOSIS — Z09 Encounter for follow-up examination after completed treatment for conditions other than malignant neoplasm: Secondary | ICD-10-CM

## 2017-02-25 DIAGNOSIS — M5116 Intervertebral disc disorders with radiculopathy, lumbar region: Secondary | ICD-10-CM | POA: Insufficient documentation

## 2017-02-25 DIAGNOSIS — K219 Gastro-esophageal reflux disease without esophagitis: Secondary | ICD-10-CM | POA: Insufficient documentation

## 2017-02-25 HISTORY — DX: Diverticulosis of intestine, part unspecified, without perforation or abscess without bleeding: K57.90

## 2017-02-25 HISTORY — DX: Polyp of colon: K63.5

## 2017-02-25 HISTORY — DX: Enterocolitis due to Clostridium difficile, not specified as recurrent: A04.72

## 2017-02-25 HISTORY — PX: LUMBAR LAMINECTOMY/DECOMPRESSION MICRODISCECTOMY: SHX5026

## 2017-02-25 LAB — POCT PREGNANCY, URINE: PREG TEST UR: NEGATIVE

## 2017-02-25 LAB — POCT I-STAT 4, (NA,K, GLUC, HGB,HCT)
Glucose, Bld: 84 mg/dL (ref 65–99)
HEMATOCRIT: 40 % (ref 36.0–46.0)
HEMOGLOBIN: 13.6 g/dL (ref 12.0–15.0)
POTASSIUM: 4.6 mmol/L (ref 3.5–5.1)
Sodium: 140 mmol/L (ref 135–145)

## 2017-02-25 SURGERY — LUMBAR LAMINECTOMY/DECOMPRESSION MICRODISCECTOMY 1 LEVEL
Anesthesia: General | Laterality: Right | Wound class: Clean

## 2017-02-25 MED ORDER — METHYLPREDNISOLONE ACETATE 40 MG/ML IJ SUSP
INTRAMUSCULAR | Status: DC | PRN
  Administered 2017-02-25: 40 mg

## 2017-02-25 MED ORDER — SEVOFLURANE IN SOLN
RESPIRATORY_TRACT | Status: AC
  Filled 2017-02-25: qty 250

## 2017-02-25 MED ORDER — DEXAMETHASONE SODIUM PHOSPHATE 10 MG/ML IJ SOLN
INTRAMUSCULAR | Status: AC
  Filled 2017-02-25: qty 1

## 2017-02-25 MED ORDER — LACTATED RINGERS IV SOLN
INTRAVENOUS | Status: DC
  Administered 2017-02-25 (×2): via INTRAVENOUS

## 2017-02-25 MED ORDER — THROMBIN 5000 UNITS EX SOLR
CUTANEOUS | Status: DC | PRN
  Administered 2017-02-25: 5000 [IU] via TOPICAL

## 2017-02-25 MED ORDER — DEXAMETHASONE SODIUM PHOSPHATE 10 MG/ML IJ SOLN
INTRAMUSCULAR | Status: DC | PRN
  Administered 2017-02-25: 10 mg via INTRAVENOUS

## 2017-02-25 MED ORDER — PROPOFOL 10 MG/ML IV BOLUS
INTRAVENOUS | Status: AC
  Filled 2017-02-25: qty 20

## 2017-02-25 MED ORDER — OXYCODONE HCL 5 MG/5ML PO SOLN: 5.0000 mg | Freq: Once | ORAL | Status: DC | PRN

## 2017-02-25 MED ORDER — ONDANSETRON HCL 4 MG/2ML IJ SOLN
INTRAMUSCULAR | Status: DC | PRN
  Administered 2017-02-25: 4 mg via INTRAVENOUS

## 2017-02-25 MED ORDER — ROCURONIUM BROMIDE 50 MG/5ML IV SOLN
INTRAVENOUS | Status: AC
  Filled 2017-02-25: qty 1

## 2017-02-25 MED ORDER — FAMOTIDINE 20 MG PO TABS
ORAL_TABLET | ORAL | Status: AC
  Filled 2017-02-25: qty 1

## 2017-02-25 MED ORDER — METHYLPREDNISOLONE ACETATE 40 MG/ML IJ SUSP
INTRAMUSCULAR | Status: AC
  Filled 2017-02-25: qty 1

## 2017-02-25 MED ORDER — SUGAMMADEX SODIUM 200 MG/2ML IV SOLN
INTRAVENOUS | Status: DC | PRN
  Administered 2017-02-25: 150 mg via INTRAVENOUS

## 2017-02-25 MED ORDER — PROPOFOL 10 MG/ML IV BOLUS
INTRAVENOUS | Status: DC | PRN
  Administered 2017-02-25: 170 mg via INTRAVENOUS

## 2017-02-25 MED ORDER — MIDAZOLAM HCL 2 MG/2ML IJ SOLN
INTRAMUSCULAR | Status: AC
  Filled 2017-02-25: qty 2

## 2017-02-25 MED ORDER — ROCURONIUM BROMIDE 100 MG/10ML IV SOLN
INTRAVENOUS | Status: DC | PRN
  Administered 2017-02-25: 20 mg via INTRAVENOUS
  Administered 2017-02-25: 30 mg via INTRAVENOUS

## 2017-02-25 MED ORDER — OXYCODONE-ACETAMINOPHEN 5-325 MG PO TABS: 1.0000 | ORAL_TABLET | ORAL | 0 refills | Status: DC | PRN

## 2017-02-25 MED ORDER — BUPIVACAINE-EPINEPHRINE (PF) 0.5% -1:200000 IJ SOLN
INTRAMUSCULAR | Status: DC | PRN
  Administered 2017-02-25: 10 mL

## 2017-02-25 MED ORDER — FENTANYL CITRATE (PF) 100 MCG/2ML IJ SOLN
INTRAMUSCULAR | Status: DC | PRN
  Administered 2017-02-25: 50 ug via INTRAVENOUS
  Administered 2017-02-25 (×2): 100 ug via INTRAVENOUS

## 2017-02-25 MED ORDER — SUGAMMADEX SODIUM 200 MG/2ML IV SOLN
INTRAVENOUS | Status: AC
  Filled 2017-02-25: qty 2

## 2017-02-25 MED ORDER — THROMBIN 20000 UNITS EX KIT
PACK | CUTANEOUS | Status: AC
  Filled 2017-02-25: qty 1

## 2017-02-25 MED ORDER — MIDAZOLAM HCL 2 MG/2ML IJ SOLN
INTRAMUSCULAR | Status: DC | PRN
  Administered 2017-02-25: 2 mg via INTRAVENOUS

## 2017-02-25 MED ORDER — THROMBIN 5000 UNITS EX SOLR
CUTANEOUS | Status: AC
  Filled 2017-02-25: qty 5000

## 2017-02-25 MED ORDER — GELATIN ABSORBABLE 12-7 MM EX MISC
CUTANEOUS | Status: DC | PRN
Start: 2017-02-25 — End: 2017-02-25
  Administered 2017-02-25: 1

## 2017-02-25 MED ORDER — BUPIVACAINE-EPINEPHRINE (PF) 0.5% -1:200000 IJ SOLN
INTRAMUSCULAR | Status: AC
  Filled 2017-02-25: qty 30

## 2017-02-25 MED ORDER — FENTANYL CITRATE (PF) 250 MCG/5ML IJ SOLN
INTRAMUSCULAR | Status: AC
  Filled 2017-02-25: qty 5

## 2017-02-25 MED ORDER — LIDOCAINE HCL (CARDIAC) 20 MG/ML IV SOLN
INTRAVENOUS | Status: DC | PRN
  Administered 2017-02-25: 50 mg via INTRAVENOUS

## 2017-02-25 MED ORDER — LIDOCAINE HCL (PF) 2 % IJ SOLN
INTRAMUSCULAR | Status: AC
  Filled 2017-02-25: qty 2

## 2017-02-25 MED ORDER — METHOCARBAMOL 500 MG PO TABS: 500.0000 mg | ORAL_TABLET | Freq: Four times a day (QID) | ORAL | 0 refills | Status: DC | PRN

## 2017-02-25 MED ORDER — GLYCOPYRROLATE 0.2 MG/ML IJ SOLN
INTRAMUSCULAR | Status: DC | PRN
  Administered 2017-02-25: 0.2 mg via INTRAVENOUS

## 2017-02-25 MED ORDER — OXYCODONE HCL 5 MG PO TABS: 5.0000 mg | ORAL_TABLET | Freq: Once | ORAL | Status: DC | PRN

## 2017-02-25 MED ORDER — DEXTROSE 5 % IV SOLN
2.0000 g | INTRAVENOUS | Status: AC
  Administered 2017-02-25: 2 g via INTRAVENOUS
  Filled 2017-02-25: qty 2000

## 2017-02-25 MED ORDER — GELATIN ABSORBABLE 12-7 MM EX MISC
CUTANEOUS | Status: AC
  Filled 2017-02-25: qty 1

## 2017-02-25 MED ORDER — ONDANSETRON HCL 4 MG/2ML IJ SOLN
INTRAMUSCULAR | Status: AC
  Filled 2017-02-25: qty 2

## 2017-02-25 MED ORDER — FENTANYL CITRATE (PF) 100 MCG/2ML IJ SOLN: 25.0000 ug | INTRAMUSCULAR | Status: DC | PRN

## 2017-02-25 MED ORDER — BUPIVACAINE LIPOSOME 1.3 % IJ SUSP
INTRAMUSCULAR | Status: DC | PRN
  Administered 2017-02-25: 20 mL

## 2017-02-25 MED ORDER — BACITRACIN 50000 UNITS IM SOLR
INTRAMUSCULAR | Status: DC | PRN
  Administered 2017-02-25: 5000 [IU]

## 2017-02-25 MED ORDER — BACITRACIN 50000 UNITS IM SOLR
INTRAMUSCULAR | Status: AC
  Filled 2017-02-25: qty 1

## 2017-02-25 MED ORDER — CEFAZOLIN SODIUM-DEXTROSE 2-4 GM/100ML-% IV SOLN
INTRAVENOUS | Status: AC
  Filled 2017-02-25: qty 100

## 2017-02-25 MED ORDER — SODIUM CHLORIDE 0.9 % IJ SOLN
INTRAMUSCULAR | Status: AC
  Filled 2017-02-25: qty 20

## 2017-02-25 MED ORDER — BUPIVACAINE LIPOSOME 1.3 % IJ SUSP
INTRAMUSCULAR | Status: AC
  Filled 2017-02-25: qty 20

## 2017-02-25 MED ORDER — FAMOTIDINE 20 MG PO TABS
20.0000 mg | ORAL_TABLET | Freq: Once | ORAL | Status: AC
  Administered 2017-02-25: 20 mg via ORAL

## 2017-02-25 MED ORDER — PHENYLEPHRINE HCL 10 MG/ML IJ SOLN
INTRAMUSCULAR | Status: DC | PRN
  Administered 2017-02-25 (×3): 100 ug via INTRAVENOUS

## 2017-02-25 SURGICAL SUPPLY — 63 items
BAND RUBBER 3X1/6 TAN STRL (MISCELLANEOUS) ×6 IMPLANT
BLADE BOVIE TIP EXT 4 (BLADE) ×3 IMPLANT
BUR NEURO DRILL SOFT 3.0X3.8M (BURR) ×3 IMPLANT
CANISTER SUCT 1200ML W/VALVE (MISCELLANEOUS) ×3 IMPLANT
CHLORAPREP W/TINT 26ML (MISCELLANEOUS) ×6 IMPLANT
CNTNR SPEC 2.5X3XGRAD LEK (MISCELLANEOUS) ×1
CONT SPEC 4OZ STER OR WHT (MISCELLANEOUS) ×2
CONTAINER SPEC 2.5X3XGRAD LEK (MISCELLANEOUS) ×1 IMPLANT
COUNTER NEEDLE 20/40 LG (NEEDLE) ×3 IMPLANT
COVER LIGHT HANDLE STERIS (MISCELLANEOUS) ×6 IMPLANT
CUP MEDICINE 2OZ PLAST GRAD ST (MISCELLANEOUS) ×6 IMPLANT
DERMABOND ADVANCED (GAUZE/BANDAGES/DRESSINGS) ×2
DERMABOND ADVANCED .7 DNX12 (GAUZE/BANDAGES/DRESSINGS) ×1 IMPLANT
DRAPE C-ARM 42X72 X-RAY (DRAPES) ×6 IMPLANT
DRAPE LAPAROTOMY 100X77 ABD (DRAPES) ×3 IMPLANT
DRAPE MICROSCOPE SPINE 48X150 (DRAPES) ×2 IMPLANT
DRAPE POUCH INSTRU U-SHP 10X18 (DRAPES) ×3 IMPLANT
DRAPE SURG 17X11 SM STRL (DRAPES) ×12 IMPLANT
DRSG TEGADERM 4X4.75 (GAUZE/BANDAGES/DRESSINGS) IMPLANT
DRSG TELFA 4X3 1S NADH ST (GAUZE/BANDAGES/DRESSINGS) IMPLANT
DURASEAL APPLICATOR TIP (TIP) IMPLANT
DURASEAL SPINE SEALANT 3ML (MISCELLANEOUS) IMPLANT
ELECT CAUTERY BLADE TIP 2.5 (TIP) ×3
ELECT EZSTD 165MM 6.5IN (MISCELLANEOUS)
ELECT REM PT RETURN 9FT ADLT (ELECTROSURGICAL) ×3
ELECTRODE CAUTERY BLDE TIP 2.5 (TIP) ×1 IMPLANT
ELECTRODE EZSTD 165MM 6.5IN (MISCELLANEOUS) IMPLANT
ELECTRODE REM PT RTRN 9FT ADLT (ELECTROSURGICAL) ×1 IMPLANT
FRAME EYE SHIELD (PROTECTIVE WEAR) ×3 IMPLANT
GLOVE SURG SYN 8.5  E (GLOVE) ×6
GLOVE SURG SYN 8.5 E (GLOVE) ×3 IMPLANT
GOWN STRL REUS W/ TWL LRG LVL3 (GOWN DISPOSABLE) ×1 IMPLANT
GOWN STRL REUS W/TWL LRG LVL3 (GOWN DISPOSABLE) ×2
GOWN STRL XL  DISP (MISCELLANEOUS) ×3 IMPLANT
GRADUATE 1200CC STRL 31836 (MISCELLANEOUS) ×3 IMPLANT
KIT SPINAL PRONEVIEW (KITS) ×2 IMPLANT
KNIFE BAYONET SHORT DISCETOMY (MISCELLANEOUS) IMPLANT
MARKER SKIN DUAL TIP RULER LAB (MISCELLANEOUS) ×6 IMPLANT
NDL SAFETY ECLIPSE 18X1.5 (NEEDLE) ×1 IMPLANT
NEEDLE HYPO 18GX1.5 SHARP (NEEDLE) ×2
NEEDLE HYPO 22GX1.5 SAFETY (NEEDLE) ×3 IMPLANT
NS IRRIG 1000ML POUR BTL (IV SOLUTION) ×3 IMPLANT
PACK LAMINECTOMY NEURO (CUSTOM PROCEDURE TRAY) ×3 IMPLANT
PAD ARMBOARD 7.5X6 YLW CONV (MISCELLANEOUS) ×3 IMPLANT
PATTIES SURGICAL .5X1.5 (GAUZE/BANDAGES/DRESSINGS) IMPLANT
SPOGE SURGIFLO 8M (HEMOSTASIS)
SPONGE SURGIFLO 8M (HEMOSTASIS) IMPLANT
STAPLER SKIN PROX 35W (STAPLE) IMPLANT
SUT DVC VLOC 3-0 CL 6 P-12 (SUTURE) ×6 IMPLANT
SUT NURALON 4 0 TR CR/8 (SUTURE) IMPLANT
SUT VIC AB 0 CT1 27 (SUTURE) ×6
SUT VIC AB 0 CT1 27XCR 8 STRN (SUTURE) ×3 IMPLANT
SUT VIC AB 2-0 CT1 18 (SUTURE) ×3 IMPLANT
SUT VIC AB 3-0 SH 27 (SUTURE) ×2
SUT VIC AB 3-0 SH 27X BRD (SUTURE) ×1 IMPLANT
SUT VICRYL 0 UR6 27IN ABS (SUTURE) ×3 IMPLANT
SYR 20CC LL (SYRINGE) ×3 IMPLANT
SYRINGE 10CC LL (SYRINGE) ×3 IMPLANT
TOWEL OR 17X26 4PK STRL BLUE (TOWEL DISPOSABLE) ×6 IMPLANT
TUBE MATRX SPINL 18MM 7CM DISP (INSTRUMENTS) ×2
TUBE METRX SPINAL 18X7 DISP (INSTRUMENTS) ×1 IMPLANT
TUBING CONNECTING 10 (TUBING) ×2 IMPLANT
TUBING CONNECTING 10' (TUBING) ×1

## 2017-02-25 NOTE — Transfer of Care (Signed)
Immediate Anesthesia Transfer of Care Note  Patient: Jordan Peters  Procedure(s) Performed: Procedure(s): LUMBAR LAMINECTOMY/DECOMPRESSION MICRODISCECTOMY 1 LEVEL (Right)  Patient Location: PACU  Anesthesia Type:General  Level of Consciousness: sedated  Airway & Oxygen Therapy: Patient Spontanous Breathing and Patient connected to face mask oxygen  Post-op Assessment: Report given to RN and Post -op Vital signs reviewed and stable  Post vital signs: Reviewed and stable  Last Vitals:  Vitals:   02/25/17 1219  BP: 118/81  Pulse: 88  Resp: 12  Temp: 36.9 C    Last Pain:  Vitals:   02/25/17 1219  TempSrc: Oral  PainSc: 4          Complications: No apparent anesthesia complications

## 2017-02-25 NOTE — Anesthesia Preprocedure Evaluation (Addendum)
Anesthesia Evaluation  Patient identified by MRN, date of birth, ID band Patient awake    Reviewed: Allergy & Precautions, H&P , NPO status , Patient's Chart, lab work & pertinent test results  History of Anesthesia Complications Negative for: history of anesthetic complications  Airway Mallampati: II  TM Distance: >3 FB Neck ROM: full    Dental  (+) Chipped, Caps   Pulmonary neg pulmonary ROS, neg shortness of breath,    Pulmonary exam normal breath sounds clear to auscultation       Cardiovascular Exercise Tolerance: Good (-) angina(-) Past MI and (-) DOE negative cardio ROS Normal cardiovascular exam Rhythm:regular Rate:Normal     Neuro/Psych  Headaches, PSYCHIATRIC DISORDERS Anxiety    GI/Hepatic Neg liver ROS, GERD  Controlled,  Endo/Other  negative endocrine ROS  Renal/GU      Musculoskeletal   Abdominal   Peds  Hematology negative hematology ROS (+)   Anesthesia Other Findings Signs and symptoms suggestive of sleep apnea   Past Medical History: No date: Anxiety No date: Back pain No date: Clostridium difficile diarrhea No date: Colon polyp No date: Diverticulosis No date: GERD (gastroesophageal reflux disease) No date: Headache No date: IBS (irritable bowel syndrome) No date: Lumbar herniated disc No date: Tremor  Past Surgical History: No date: COLONOSCOPY No date: DILATION AND CURETTAGE OF UTERUS No date: ESOPHAGOGASTRODUODENOSCOPY ENDOSCOPY No date: POLYPECTOMY No date: uterine cyst  BMI    Body Mass Index:  31.83 kg/m      Reproductive/Obstetrics negative OB ROS                            Anesthesia Physical Anesthesia Plan  ASA: III  Anesthesia Plan: General ETT   Post-op Pain Management:    Induction: Intravenous  Airway Management Planned: Oral ETT  Additional Equipment:   Intra-op Plan:   Post-operative Plan: Extubation in OR  Informed  Consent: I have reviewed the patients History and Physical, chart, labs and discussed the procedure including the risks, benefits and alternatives for the proposed anesthesia with the patient or authorized representative who has indicated his/her understanding and acceptance.   Dental Advisory Given  Plan Discussed with: Anesthesiologist, CRNA and Surgeon  Anesthesia Plan Comments: (Patient consented for risks of anesthesia including but not limited to:  - adverse reactions to medications - damage to teeth, lips or other oral mucosa - sore throat or hoarseness - Damage to heart, brain, lungs or loss of life  Patient voiced understanding.)        Anesthesia Quick Evaluation

## 2017-02-25 NOTE — H&P (Signed)
I have reviewed and confirmed my history and physical from 02/05/17 with no additions or changes. Plan for R L4-5 microdiscectomy.  Risks and benefits reviewed.  Heart sounds normal with no MRG. Chest CTAB

## 2017-02-25 NOTE — Op Note (Signed)
Indications: Jordan Peters is a 51 yo female with lumbar radiculopathy secondary to L4-5 disc herniation. She failed conservative management.  Findings: Disc herniation  Preoperative Diagnosis: Lumbar radiculopathy Postoperative Diagnosis: same   EBL: 25 ml IVF:  1000 ml Drains: none Disposition: Extubated and Stable to PACU Complications: none  No foley catheter was placed.   Preoperative Note:   Risks of surgery discussed include: infection, bleeding, stroke, coma, death, paralysis, CSF leak, nerve/spinal cord injury, numbness, tingling, weakness, complex regional pain syndrome, recurrent stenosis and/or disc herniation, vascular injury, development of instability, neck/back pain, need for further surgery, persistent symptoms, development of deformity, and the risks of anesthesia. They understood these risks and have agreed to proceed.  Operative Note:    The patient was then brought from the preoperative center with intravenous access established.  The patient underwent general anesthesia and endotracheal tube intubation, and was then rotated on the [place] rail top where all pressure points were appropriately padded.  The skin was then thoroughly cleansed.  Perioperative antibiotic prophylaxis was administered.  Sterile prep and drapes were then applied and a timeout was then observed.  C-arm was brought into the field under sterile conditions, and the L4-5 disc space identified and marked with an incision on the right 1cm lateral to midline.  Once this was complete a 2 cm incision was opened with the use of a #10 blade knife.  The metrics tubes were sequentially advanced under lateral fluoroscopy until a 18 x 70 mm Metrix tube was placed over the facet and lamina and secured to the bed.    The microscope was then sterilely brought into the field and muscle creep was hemostased with a bipolar and resected with a pituitary rongeur.  A Bovie extender was then used to expose the spinous  process and lamina.  Careful attention was placed to not violate the facet capsule. A 3 mm matchstick drill bit was then used to make a hemi-laminotomy trough until the ligamentum flavum was exposed.  This was extended to the base of the spinous process.  Once this was complete and the underlying ligamentum flavum was visualized this was dissected with an up angle curette and resected with a #2 and #3 mm biting Kerrison.  The laminotomy opening was also expanded in similar fashion and hemostasis was obtained with Surgifoam and a patty as well as bone wax.  The rostral aspect of the caudal level of the lamina was also resected with a #2 biting Kerrison effort to further enhance exposure.  Once the underlying dura was visualized a Penfield 4 was then used to dissect and expose the traversing nerve root.  Once this was identified a nerve root retractor was used to mobilize this medially.  The venous plexus was hemostased with Surgifoam and light bipolar use.  An annulotomy was made with the bipolar. Disc space contents were noted to to come through the annulus.    The disc herniation was identified and dissected free using a balltip probe. The pituitary rongeur was used to remove the extruded disc fragments. Once the thecal sac and nerve root were noted to be relaxed and under less tension the ball-tipped feeler was passed along the foramen distally to to ensure no residual compression was noted.  With none noted, attention was then turned to closure.    A Depo-Medrol soaked Gelfoam pledget was placed along the nerve root for 2 minutes and removed.  The area was irrigated. The tube system was then removed under microscopic visualization  and hemostasis was obtained with a bipolar.    The fascial layer was reapproximated with the use of a 0- Vicryl suture.  Subcutaneous tissue layer was reapproximated using 2-0 Vicryl suture.  3-0 monocryl was used on the skin. The skin was then cleansed and Dermabond was used to  close the skin opening.  Patient was then rotated back to the preoperative bed awakened from anesthesia and taken to recovery all counts are correct in this case.   I performed the entire surgery with Anabel Halon PA as an Geophysicist/field seismologist.  Venetia Night MD

## 2017-02-25 NOTE — Anesthesia Procedure Notes (Signed)
Procedure Name: Intubation Date/Time: 02/25/2017 2:54 PM Performed by: Jonna Clark Pre-anesthesia Checklist: Patient identified, Patient being monitored, Timeout performed, Emergency Drugs available and Suction available Patient Re-evaluated:Patient Re-evaluated prior to inductionOxygen Delivery Method: Circle system utilized Preoxygenation: Pre-oxygenation with 100% oxygen Intubation Type: IV induction Ventilation: Mask ventilation without difficulty Laryngoscope Size: Mac and 3 Grade View: Grade I Tube type: Oral Tube size: 7.0 mm Number of attempts: 1 Airway Equipment and Method: Stylet Placement Confirmation: ETT inserted through vocal cords under direct vision,  positive ETCO2 and breath sounds checked- equal and bilateral Secured at: 21 cm Tube secured with: Tape Dental Injury: Teeth and Oropharynx as per pre-operative assessment

## 2017-02-25 NOTE — Anesthesia Post-op Follow-up Note (Cosign Needed)
Anesthesia QCDR form completed.        

## 2017-02-25 NOTE — Discharge Instructions (Addendum)
AMBULATORY SURGERY  DISCHARGE INSTRUCTIONS   1) The drugs that you were given will stay in your system until tomorrow so for the next 24 hours you should not:  A) Drive an automobile B) Make any legal decisions C) Drink any alcoholic beverage   2) You may resume regular meals tomorrow.  Today it is better to start with liquids and gradually work up to solid foods.  You may eat anything you prefer, but it is better to start with liquids, then soup and crackers, and gradually work up to solid foods.   3) Please notify your doctor immediately if you have any unusual bleeding, trouble breathing, redness and pain at the surgery site, drainage, fever, or pain not relieved by medication.    4) Additional Instructions:        Please contact your physician with any problems or Same Day Surgery at [phone], Monday through Friday 6 am to 4 pm, or [place] [facility] number at [phone].   Your surgeon has performed an operation on your lumbar spine (low back) to relieve pressure on one or more nerves. Many times, patients feel better immediately after surgery and can overdo it. Even if you feel well, it is important that you follow these activity guidelines. If you do not let your back heal properly from the surgery, you can increase the chance of a disc herniation and return of your symptoms. The following are instructions to help in your recovery once you have been discharged from the hospital.  * Do not take anti-inflammatory medications for 3 days after surgery (naproxen [Aleve], ibuprofen [Advil, Motrin], celecoxib [Celebrex], etc.)  Activity    No bending, lifting, or twisting (BLT). Avoid lifting objects heavier than 10 pounds (gallon milk jug).  Where possible, avoid household activities that involve lifting, bending, pushing, or pulling such as laundry, vacuuming, grocery shopping, and childcare. Try to arrange for help from friends and family for these  activities while your back heals.  Increase physical activity slowly as tolerated.  Taking short walks is encouraged, but avoid strenuous exercise. Do not jog, run, bicycle, lift weights, or participate in any other exercises unless specifically allowed by your doctor. Avoid prolonged sitting, including car rides.  Talk to your doctor before resuming sexual activity.  You should not drive until cleared by your doctor.  Until released by your doctor, you should not return to work or school.  You should rest at home and let your body heal.   You may shower three days after your surgery.  After showering, lightly dab your incision dry. Do not take a tub bath or go swimming until approved by your doctor at your follow-up appointment.  If you smoke, we strongly recommend that you quit.  Smoking has been proven to interfere with normal healing in your back and will dramatically reduce the success rate of your surgery. Please contact [facility] (800-QUIT-NOW) and use the resources at www.[facility].com for assistance in stopping smoking.  Surgical Incision   If you have a dressing on your incision, you may remove it two days after your surgery. Keep your incision area clean and dry.  If you have staples or stitches on your incision, you should have a follow up scheduled for removal. If you do not have staples or stitches, you will have steri-strips (small pieces of surgical tape) or Dermabond glue. The steri-strips/glue should begin to peel away within about a week (it is fine if the steri-strips fall off before then). If  the strips are still in place one week after your surgery, you may gently remove them.  Diet            You may return to your usual diet. Be sure to stay hydrated.  When to Contact us  Although your surgery and recovery will likely be uneventful, you may have some residual numbness, aches, and pains in your back and/or legs. This is normal and should improve in the next few  weeks.  However, should you experience any of the following, contact us immediately:  New numbness or weakness  Pain that is progressively getting worse, and is not relieved by your pain medications or rest  Bleeding, redness, swelling, pain, or drainage from surgical incision  Chills or flu-like symptoms  Fever greater than 101.0 F (38.3 C)  Problems with bowel or bladder functions  Difficulty breathing or shortness of breath  Warmth, tenderness, or swelling in your calf  Contact Information  During office hours (Monday-Friday 9 am to 5 pm), please call your physician at [phone]  After hours and weekends, please call the [facility] Operator at [phone] and ask for the Neurosurgery Resident On Call   For a life-threatening emergency, call 911

## 2017-02-25 NOTE — Anesthesia Postprocedure Evaluation (Signed)
Anesthesia Post Note  Patient: Jordan Peters  Procedure(s) Performed: Procedure(s) (LRB): LUMBAR LAMINECTOMY/DECOMPRESSION MICRODISCECTOMY 1 LEVEL (Right)  Patient location during evaluation: PACU Anesthesia Type: General Level of consciousness: awake and alert Pain management: pain level controlled Vital Signs Assessment: post-procedure vital signs reviewed and stable Respiratory status: spontaneous breathing and respiratory function stable Cardiovascular status: stable Anesthetic complications: no     Last Vitals:  Vitals:   02/25/17 1219 02/25/17 1617  BP: 118/81 112/68  Pulse: 88 (!) 107  Resp: 12 14  Temp: 36.9 C 36.3 C    Last Pain:  Vitals:   02/25/17 1617  TempSrc:   PainSc: 0-No pain                 Betta Balla K

## 2017-02-26 ENCOUNTER — Encounter: Payer: Self-pay | Admitting: Neurosurgery

## 2017-05-26 ENCOUNTER — Ambulatory Visit: Payer: Managed Care, Other (non HMO) | Admitting: Dietician

## 2017-05-27 ENCOUNTER — Ambulatory Visit: Payer: Managed Care, Other (non HMO) | Admitting: Physical Therapy

## 2017-06-03 ENCOUNTER — Ambulatory Visit: Payer: Managed Care, Other (non HMO) | Attending: Neurosurgery | Admitting: Physical Therapy

## 2017-06-03 DIAGNOSIS — G8929 Other chronic pain: Secondary | ICD-10-CM | POA: Insufficient documentation

## 2017-06-03 DIAGNOSIS — R293 Abnormal posture: Secondary | ICD-10-CM | POA: Insufficient documentation

## 2017-06-03 DIAGNOSIS — M545 Low back pain: Secondary | ICD-10-CM | POA: Diagnosis present

## 2017-06-03 DIAGNOSIS — R262 Difficulty in walking, not elsewhere classified: Secondary | ICD-10-CM

## 2017-06-03 DIAGNOSIS — M6281 Muscle weakness (generalized): Secondary | ICD-10-CM | POA: Insufficient documentation

## 2017-06-05 ENCOUNTER — Encounter: Payer: Self-pay | Admitting: Physical Therapy

## 2017-06-05 ENCOUNTER — Encounter: Payer: Managed Care, Other (non HMO) | Attending: Family Medicine | Admitting: Dietician

## 2017-06-05 ENCOUNTER — Encounter: Payer: Self-pay | Admitting: Dietician

## 2017-06-05 VITALS — Ht 60.0 in | Wt 164.5 lb

## 2017-06-05 DIAGNOSIS — Z713 Dietary counseling and surveillance: Secondary | ICD-10-CM | POA: Insufficient documentation

## 2017-06-05 DIAGNOSIS — K589 Irritable bowel syndrome without diarrhea: Secondary | ICD-10-CM | POA: Diagnosis not present

## 2017-06-05 DIAGNOSIS — K580 Irritable bowel syndrome with diarrhea: Secondary | ICD-10-CM

## 2017-06-05 NOTE — Therapy (Addendum)
[facility] [address] [place], [place], [address] Phone: [phone]   Fax:  [phone]  Physical Therapy Evaluation  Patient Details  Name: Jordan GoMelanie Ann Mitch MRN: [id number] Date of Birth: 08/24/1966 Referring Provider: Dr. Myer HaffYarbrough  Encounter Date: 06/03/2017      PT End of Session - 06/05/17 1725    Visit Number 1   Number of Visits 8   Date for PT Re-Evaluation 07/01/17   PT Start Time 1023   PT Stop Time 1119   PT Time Calculation (min) 56 min   Activity Tolerance Patient tolerated treatment well;No increased pain   Behavior During Therapy WFL for tasks assessed/performed       Past Medical History:  Diagnosis Date  . Anxiety   . Back pain   . Clostridium difficile diarrhea   . Colon polyp   . Diverticulosis   . GERD (gastroesophageal reflux disease)   . Headache   . IBS (irritable bowel syndrome)   . Lumbar herniated disc   . Tremor     Past Surgical History:  Procedure Laterality Date  . COLONOSCOPY    . DILATION AND CURETTAGE OF UTERUS    . ESOPHAGOGASTRODUODENOSCOPY ENDOSCOPY    . LUMBAR LAMINECTOMY/DECOMPRESSION MICRODISCECTOMY Right 02/25/2017   Procedure: LUMBAR LAMINECTOMY/DECOMPRESSION MICRODISCECTOMY 1 LEVEL;  Surgeon: Venetia Nighthester Yarbrough, MD;  Location: [facility] ORS;  Service: Neurosurgery;  Laterality: Right;  . POLYPECTOMY    . uterine cyst      There were no vitals filed for this visit.     Pt. s/p lumbar discectomy/ laminectomy in May 2018 and has been doing better but reports stiffness in lumbar spine.   No radicular symptoms noted.  Pt. takes no medications for back pain, just tightness at this time.       See HEP/ handouts.    Pt. is a pleasant 51 y/o female s/p lumbar discectomy/ lamenectomy in May 2018.  Pt. reports marked decrease in low back pain with no radicular symptoms reported at this time.  Pts. primary complaint is lumbar stiffness around surgical site and core muscle  weakness.  Lumbar AROM WNL and B LE muscle strength grossly 4+/5 MMT.  Minimal tenderness over lumbar paraspinals.  Good B hamstring/ piriformis/ trunk rotn. flexibility.  MODI: 42%.  Pt. has difficulty with proper floor to waist lifting secondary to low back and wants to return to golfing.  Pt. will benefit from skilled PT services to increase core stability/ decrease lumbar tightness to promote return to full functional mobility.          PT Long Term Goals - 06/04/17 1553      PT LONG TERM GOAL #1   Title Pt will score <30% on Modified Oswestry to increase self percieved mobility   Baseline 8/8: 42%    Time 4   Period Weeks   Status New     PT LONG TERM GOAL #2   Title Pt. will report no lumbar tightness with work-related tasks to improve pain-free mobility.     Baseline reports of lumbar paraspinal tightness.    Time 4   Period Weeks   Status New     PT LONG TERM GOAL #3   Title Pt will demonstrate proper mechanics with floor to waist lifting/ B carrying of >20 lbs to improve functional mobility.     Baseline difficulty with proper mechanics/ upright posture.    Time 4   Period Weeks   Status New  PT LONG TERM GOAL #4   Title Pt will be able to ambulate 2 miles with no c/o pain to promote return to regular exercise program   Baseline Planning on return to aquatic ex./ gym based ex. program.   Wants to return to golfing.    Time 4   Period Weeks   Status New       Patient will benefit from skilled therapeutic intervention in order to improve the following deficits and impairments:  Decreased activity tolerance, Decreased mobility, Decreased range of motion, Decreased strength, Hypomobility, Increased muscle spasms, Impaired flexibility, Improper body mechanics, Pain  Visit Diagnosis: Difficulty in walking, not elsewhere classified  Muscle weakness (generalized)  Abnormal posture  Chronic midline low back pain without sciatica     Problem List Patient Active  Problem List   Diagnosis Date Noted  . Vitamin D deficiency 12/19/2015   Jordan Peters, PT, DPT # [id number] 06/05/2017, 3:57 PM  [facility] [address] [place], [place], [address] Phone: [phone]   Fax:  [phone]  Name: Jordan Peters MRN: [id number] Date of Birth: 03-06-1966

## 2017-06-05 NOTE — Progress Notes (Signed)
Medical Nutrition Therapy: Visit start time: 1030  end time: 1130  Assessment:  Diagnosis: IBS-D Past medical history: Vitamin D deficiency, CDiff, Diverticulosis, see chart Psychosocial issues/ stress concerns: none Preferred learning method:  Jordan Peters. Visual  Current weight: 164.5lb  Height: 5' Medications, supplements: MVI, Probiotic, Vitamin C, Melatonin, see chart  Progress and evaluation: Patient desires to learn about how to manage IBS-D symptoms and lose weight. Sx increased after having CDiff "years ago." H/O trying fad diets: Ketogenic (worsened IBS-D symptoms), BRAT diet for IBS, Nutrisystem, Weight Watchers, Dr. Wilkie AyeKristy Funk's anti-breast cancer diet. Started Dr. Joycelyn RuaFunk's diet last week and is currently trying to follow its guidelines. The diet does not include meat or dairy but does focus more on whole foods which is a goal for this patient to consume more of. Reports doing a Pinner food intolerance test which identified her to be intolerant to Mal[place]urkey, coconut, tilapia, spelt, lentils. Finds it difficult to eat enough vegetables but reports high intake of fruit. Dairy consumption comes mainly from cheese and butter. She and her husband cook "simply" at home. They do not fry foods. She reports being familiar with portion sizes but isn't always diligent with following them.  Physical activity: not at this time. Likes to walk but doesn't do it often. Has an ultimate goal to walk 3-365miles/day but has not started at this time.  Dietary Intake:  Usual eating pattern includes 3 meals and 2 snacks per day. Dining out frequency: 2-3 meals per week.  Breakfast: smoothie (2 cups spinach, 2 cups berries, banana, 1.5 cups almond milk, 1Tbs ground flaxseed, tumeric, pepper, ulma gooseberry extract powder, aloe vera gel) Snack: handful of cashews, a fruit, popcorn, crackers Lunch: whole grain bread with avocado Snack: n/a Supper: corn, rice pilaf, watermelon, sweet potato, salad, broccoli  Snack: same as  previous, sometimes chocolate Beverages: green tea, water, essential oils in water  Nutrition Care Education: Topics covered: BRAT diet, recommendations for different brands of fiber supplementation, dietary sources of probiotics and prebiotics, parameters of a legitimate probiotic supplement, fiber recommendations, soluble and insoluble fiber and dietary sources of each, ways to add protein to meals and snacks Basic nutrition: basic food groups, appropriate nutrient balance, appropriate meal and snack schedule, general nutrition guidelines    Weight control: benefits of weight control, identifying healthy weight, determining reasonable weight goal, behavioral changes for weight loss Advanced nutrition: recipe modification, cooking techniques Other lifestyle changes:  benefits of making changes, increasing motivation, readiness for change, identifying habits that need to change   Nutritional Diagnosis:  Fidelity-1.4 Altered GI function As related to dx of IBS.  As evidenced by patient report of recurrent diarrhea, inconsistent bowel movements, and h/o CDiff.  Intervention: Discussion as noted above. Goals were created to help patient improve IBS symptoms and to have a more balanced diet.  Education Materials given:  . IBS Nutrition Therapy . Goals/ instructions  Learner/ who was taught:  . Patient   Level of understanding: Marland Kitchen. Verbalizes/ demonstrates competency  Demonstrated degree of understanding via:   Teach back Learning barriers: . None  Willingness to learn/ readiness for change: . Eager, change in progress  Monitoring and Evaluation:  Dietary intake, exercise, GI symptoms, and body weight      follow up: prn

## 2017-06-05 NOTE — Patient Instructions (Addendum)
-   Aim for 25g/day of fiber, focusing on soluble fiber for diarrhea symptoms. Increase fiber intake slowly!  - Continue to eat more whole foods to ensure you are getting enough fiber and prebiotics (fermented foods, skins of fruits and veggies, pickled foods, tempeh)  -Choose foods that contain probiotics such as greek yogurt. If buying a supplement, make sure they are LIVE active cultures (I.E. refrigerated)  -Focus on adding protein to meals and snacks when you can. Remember, protein, fiber and healthy fats help to keep you full and satisfied.

## 2017-06-09 ENCOUNTER — Encounter: Payer: Managed Care, Other (non HMO) | Admitting: Physical Therapy

## 2017-06-10 ENCOUNTER — Encounter: Payer: Self-pay | Admitting: Physical Therapy

## 2017-06-10 ENCOUNTER — Ambulatory Visit: Payer: Managed Care, Other (non HMO) | Admitting: Physical Therapy

## 2017-06-10 DIAGNOSIS — R262 Difficulty in walking, not elsewhere classified: Secondary | ICD-10-CM | POA: Diagnosis not present

## 2017-06-10 DIAGNOSIS — G8929 Other chronic pain: Secondary | ICD-10-CM

## 2017-06-10 DIAGNOSIS — M6281 Muscle weakness (generalized): Secondary | ICD-10-CM

## 2017-06-10 DIAGNOSIS — R293 Abnormal posture: Secondary | ICD-10-CM

## 2017-06-10 DIAGNOSIS — M545 Low back pain: Secondary | ICD-10-CM

## 2017-06-10 NOTE — Addendum Note (Signed)
Addended by: Cammie McgeeSHERK, Milos Milligan C on: 06/10/2017 04:01 PM   Modules accepted: Orders

## 2017-06-10 NOTE — Therapy (Signed)
[facility] [address] [place], [place], [address] Phone: [phone]   Fax:  [phone]  Physical Therapy Treatment  Patient Details  Name: Jordan Peters MRN: [id number] Date of Birth: [birth year] Referring Provider: Dr. Myer Haff  Encounter Date: 06/10/2017      PT End of Session - 06/10/17 1615    Visit Number 2   Number of Visits 8   Date for PT Re-Evaluation 07/01/17   PT Start Time 1104   PT Stop Time 1200   PT Time Calculation (min) 56 min   Activity Tolerance Patient tolerated treatment well;No increased pain   Behavior During Therapy WFL for tasks assessed/performed      Past Medical History:  Diagnosis Date  . Anxiety   . Back pain   . Clostridium difficile diarrhea   . Colon polyp   . Diverticulosis   . GERD (gastroesophageal reflux disease)   . Headache   . IBS (irritable bowel syndrome)   . Lumbar herniated disc   . Tremor     Past Surgical History:  Procedure Laterality Date  . COLONOSCOPY    . DILATION AND CURETTAGE OF UTERUS    . ESOPHAGOGASTRODUODENOSCOPY ENDOSCOPY    . LUMBAR LAMINECTOMY/DECOMPRESSION MICRODISCECTOMY Right 02/25/2017   Procedure: LUMBAR LAMINECTOMY/DECOMPRESSION MICRODISCECTOMY 1 LEVEL;  Surgeon: Venetia Night, MD;  Location: [facility] ORS;  Service: Neurosurgery;  Laterality: Right;  . POLYPECTOMY    . uterine cyst      There were no vitals filed for this visit.      Subjective Assessment - 06/10/17 1611    Subjective Pt. reports good compliance with HEP.   Pt. states she was sore the morning after doing Yoga but no increase c/o LBP.     Pertinent History see previous PT notes.     Limitations Walking;House hold activities;Lifting   Diagnostic tests See MRI   Patient Stated Goals Increase lumbar mobility/ core stability   Currently in Pain? No/denies        There.ex:  Reviewed HEP.  Prone press-ups 10x.  Supine TrA ex.: reviewed core contraction with bridging and  added single leg lifts 10x each.  Supine hip abd. With RTB 20x/ hip flexion maintaining mid-line 20x.    Manual tx:  Supine LE/Lumbar stretches (11 min.).  Prone grade II-III PA mobs. (unilateral/ central) T10-L2 region 2x20 sec.  Good mobility with minimal tenderness.  STM to lumbar paraspinals.          PT Long Term Goals - 06/04/17 1553      PT LONG TERM GOAL #1   Title Pt will score <30% on Modified Oswestry to increase self percieved mobility   Baseline 8/8: 42%    Time 4   Period Weeks   Status New     PT LONG TERM GOAL #2   Title Pt. will report no lumbar tightness with work-related tasks to improve pain-free mobility.     Baseline reports of lumbar paraspinal tightness.    Time 4   Period Weeks   Status New     PT LONG TERM GOAL #3   Title Pt will demonstrate proper mechanics with floor to waist lifting/ B carrying of >20 lbs to improve functional mobility.     Baseline difficulty with proper mechanics/ upright posture.    Time 4   Period Weeks   Status New     PT LONG TERM GOAL #4   Title Pt will be able to ambulate 2 miles  with no c/o pain to promote return to regular exercise program   Baseline Planning on return to aquatic ex./ gym based ex. program.   Wants to return to golfing.    Time 4   Period Weeks   Status New               Plan - 06/10/17 1615    Clinical Impression Statement Lumbar AROM WNL.  Good lumbar mobility with prone press-ups/ grade III PA mobs. to T10-L2 central/unilateral spine.  No changes to HEP at this time with continued focus on core stability ex.    Clinical Presentation Stable   Clinical Decision Making Moderate   Rehab Potential Good   Clinical Impairments Affecting Rehab Potential Positive: Age, Previous response to therapy, Active. Negative: Chronic   PT Frequency 2x / week   PT Duration 4 weeks   PT Treatment/Interventions ADLs/Self Care Home Management;Aquatic Therapy;Cryotherapy;Electrical Stimulation;Moist  Heat;Traction;Gait training;Stair training;Functional mobility training;Therapeutic activities;Therapeutic exercise;Balance training;Neuromuscular re-education;Patient/family education;Manual techniques   PT Next Visit Plan core progression/ STM   PT Home Exercise Plan TrA activation/progressive exercise program with piriformis/hamstring stretching program   Consulted and Agree with Plan of Care Patient      Patient will benefit from skilled therapeutic intervention in order to improve the following deficits and impairments:  Decreased activity tolerance, Decreased mobility, Decreased range of motion, Decreased strength, Hypomobility, Increased muscle spasms, Impaired flexibility, Improper body mechanics, Pain  Visit Diagnosis: Difficulty in walking, not elsewhere classified  Muscle weakness (generalized)  Abnormal posture  Chronic midline low back pain without sciatica     Problem List Patient Active Problem List   Diagnosis Date Noted  . Vitamin D deficiency 12/19/2015   Cammie McgeeMichael C Sherk, PT, DPT # [phone] 06/10/2017, 4:29 PM  [facility] [address] [place], [place], [address] Phone: [phone]   Fax:  [phone]  Name: Jordan Peters MRN: [id number] Date of Birth: 03/09/1966

## 2017-06-17 ENCOUNTER — Ambulatory Visit: Payer: Managed Care, Other (non HMO) | Admitting: Physical Therapy

## 2017-06-17 DIAGNOSIS — R262 Difficulty in walking, not elsewhere classified: Secondary | ICD-10-CM

## 2017-06-17 DIAGNOSIS — R293 Abnormal posture: Secondary | ICD-10-CM

## 2017-06-17 DIAGNOSIS — M6281 Muscle weakness (generalized): Secondary | ICD-10-CM

## 2017-06-17 DIAGNOSIS — M545 Low back pain: Secondary | ICD-10-CM

## 2017-06-17 DIAGNOSIS — G8929 Other chronic pain: Secondary | ICD-10-CM

## 2017-06-18 NOTE — Therapy (Signed)
[facility] [address] [place], [place], [address] Phone: [phone]   Fax:  [phone]  Physical Therapy Treatment  Patient Details  Name: Jordan Peters MRN: [id number] Date of Birth: 1965/12/21 Referring Provider: Dr. Myer Haff  Encounter Date: 06/17/2017      PT End of Session - 06/18/17 2043    Visit Number 3   Number of Visits 8   Date for PT Re-Evaluation 07/01/17   PT Start Time 1114   PT Stop Time 1201   PT Time Calculation (min) 47 min   Activity Tolerance Patient tolerated treatment well;No increased pain   Behavior During Therapy WFL for tasks assessed/performed      Past Medical History:  Diagnosis Date  . Anxiety   . Back pain   . Clostridium difficile diarrhea   . Colon polyp   . Diverticulosis   . GERD (gastroesophageal reflux disease)   . Headache   . IBS (irritable bowel syndrome)   . Lumbar herniated disc   . Tremor     Past Surgical History:  Procedure Laterality Date  . COLONOSCOPY    . DILATION AND CURETTAGE OF UTERUS    . ESOPHAGOGASTRODUODENOSCOPY ENDOSCOPY    . LUMBAR LAMINECTOMY/DECOMPRESSION MICRODISCECTOMY Right 02/25/2017   Procedure: LUMBAR LAMINECTOMY/DECOMPRESSION MICRODISCECTOMY 1 LEVEL;  Surgeon: Venetia Night, MD;  Location: [facility] ORS;  Service: Neurosurgery;  Laterality: Right;  . POLYPECTOMY    . uterine cyst      There were no vitals filed for this visit.      Subjective Assessment - 06/18/17 2042    Subjective Pt. states she is doing okay but feels "tightness/ stiff" in low back at this time.  Pt. has gone to [place] since last PT appt. but has not seen massage therapist.    Pertinent History see previous PT notes.     Limitations Walking;House hold activities;Lifting   Diagnostic tests See MRI   Patient Stated Goals Increase lumbar mobility/ core stability   Currently in Pain? No/denies      There.ex:  Reviewed standing lumbar flexion/ extension/ rotn. (no  pain).  Prone press-ups 10x.  Supine TrA ex.: reviewed core contraction with bridging and added single leg lifts 10x each.  Supine hip abd. With manual resistance 20x/ hip flexion maintaining mid-line 20x.  Supine hip adduction with manual feedback 10x with holds.      Manual tx:  Supine LE/Lumbar stretches (7 min.).  Prone grade II-III PA mobs. (unilateral/ central) T10-L2 region 2x20 sec.  Good mobility with minimal tenderness.  STM to lumbar paraspinals.     Instructed in ice vs. Heat.       PT Long Term Goals - 06/04/17 1553      PT LONG TERM GOAL #1   Title Pt will score <30% on Modified Oswestry to increase self percieved mobility   Baseline 8/8: 42%    Time 4   Period Weeks   Status New     PT LONG TERM GOAL #2   Title Pt. will report no lumbar tightness with work-related tasks to improve pain-free mobility.     Baseline reports of lumbar paraspinal tightness.    Time 4   Period Weeks   Status New     PT LONG TERM GOAL #3   Title Pt will demonstrate proper mechanics with floor to waist lifting/ B carrying of >20 lbs to improve functional mobility.     Baseline difficulty with proper mechanics/ upright posture.  Time 4   Period Weeks   Status New     PT LONG TERM GOAL #4   Title Pt will be able to ambulate 2 miles with no c/o pain to promote return to regular exercise program   Baseline Planning on return to aquatic ex./ gym based ex. program.   Wants to return to golfing.    Time 4   Period Weeks   Status New               Plan - 06/18/17 2044    Clinical Impression Statement Pt. progressing well with core stability/ posture correction.  Good technique with higher level bridging and added leg lifts (fatigue, not pain reported).  Pt. will continue to benefit from core stability ex. program and manual tx. to increase spinal mobility superior to surgical site.     Clinical Presentation Stable   Clinical Decision Making Moderate   Rehab Potential Good    Clinical Impairments Affecting Rehab Potential Positive: Age, Previous response to therapy, Active. Negative: Chronic   PT Frequency 2x / week   PT Duration 4 weeks   PT Treatment/Interventions ADLs/Self Care Home Management;Aquatic Therapy;Cryotherapy;Electrical Stimulation;Moist Heat;Traction;Gait training;Stair training;Functional mobility training;Therapeutic activities;Therapeutic exercise;Balance training;Neuromuscular re-education;Patient/family education;Manual techniques   PT Next Visit Plan core progression/ STM   PT Home Exercise Plan TrA activation/progressive exercise program with piriformis/hamstring stretching program   Consulted and Agree with Plan of Care Patient      Patient will benefit from skilled therapeutic intervention in order to improve the following deficits and impairments:  Decreased activity tolerance, Decreased mobility, Decreased range of motion, Decreased strength, Hypomobility, Increased muscle spasms, Impaired flexibility, Improper body mechanics, Pain  Visit Diagnosis: Difficulty in walking, not elsewhere classified  Muscle weakness (generalized)  Abnormal posture  Chronic midline low back pain without sciatica     Problem List Patient Active Problem List   Diagnosis Date Noted  . Vitamin D deficiency 12/19/2015   Cammie Mcgee, PT, DPT # [phone] 06/18/2017, 8:47 PM  [place] [facility] [address] [place], [place], [address] Phone: [phone]   Fax:  [phone]  Name: Solimar Maiden MRN: [id number] Date of Birth: 21-Jan-1966

## 2017-06-24 ENCOUNTER — Encounter: Payer: Managed Care, Other (non HMO) | Admitting: Physical Therapy

## 2017-07-01 ENCOUNTER — Ambulatory Visit: Payer: Managed Care, Other (non HMO) | Attending: Neurosurgery | Admitting: Physical Therapy

## 2017-07-01 ENCOUNTER — Encounter: Payer: Self-pay | Admitting: Physical Therapy

## 2017-07-01 DIAGNOSIS — R262 Difficulty in walking, not elsewhere classified: Secondary | ICD-10-CM | POA: Diagnosis not present

## 2017-07-01 DIAGNOSIS — R293 Abnormal posture: Secondary | ICD-10-CM | POA: Diagnosis present

## 2017-07-01 DIAGNOSIS — G8929 Other chronic pain: Secondary | ICD-10-CM | POA: Insufficient documentation

## 2017-07-01 DIAGNOSIS — M545 Low back pain: Secondary | ICD-10-CM | POA: Diagnosis present

## 2017-07-01 DIAGNOSIS — M6281 Muscle weakness (generalized): Secondary | ICD-10-CM | POA: Diagnosis present

## 2017-07-01 NOTE — Therapy (Signed)
[facility] [address] [place], [place], [address] Phone: [phone]   Fax:  [phone]  Physical Therapy Treatment  Patient Details  Name: Jordan Peters MRN: [id number] Date of Birth: 08/19/1966 Referring Provider: Dr. Izora Ribas  Encounter Date: 07/01/2017      PT End of Session - 07/01/17 1419    Visit Number 4   Number of Visits 8   Date for PT Re-Evaluation 07/01/17   PT Start Time 2951   PT Stop Time 8841   PT Time Calculation (min) 46 min   Activity Tolerance Patient tolerated treatment well;No increased pain   Behavior During Therapy WFL for tasks assessed/performed      Past Medical History:  Diagnosis Date  . Anxiety   . Back pain   . Clostridium difficile diarrhea   . Colon polyp   . Diverticulosis   . GERD (gastroesophageal reflux disease)   . Headache   . IBS (irritable bowel syndrome)   . Lumbar herniated disc   . Tremor     Past Surgical History:  Procedure Laterality Date  . COLONOSCOPY    . DILATION AND CURETTAGE OF UTERUS    . ESOPHAGOGASTRODUODENOSCOPY ENDOSCOPY    . LUMBAR LAMINECTOMY/DECOMPRESSION MICRODISCECTOMY Right 02/25/2017   Procedure: LUMBAR LAMINECTOMY/DECOMPRESSION MICRODISCECTOMY 1 LEVEL;  Surgeon: Meade Maw, MD;  Location: [facility] ORS;  Service: Neurosurgery;  Laterality: Right;  . POLYPECTOMY    . uterine cyst      There were no vitals filed for this visit.      Subjective Assessment - 07/01/17 1418    Subjective Pt. states she has no pain today but was limited with ability to hike at [place].  Pt. reports compliance/ understanding with HEP.  Pt. went to massage therapist and tolerated well.  No issues.     Pertinent History see previous PT notes.     Limitations Walking;House hold activities;Lifting   Diagnostic tests See MRI   Patient Stated Goals Increase lumbar mobility/ core stability   Currently in Pain? No/denies      There.ex:  Prone  press-ups 5x. Scifit L6 10 min. Reassessed gait pattern/ HEP progression.  Good technique with TrA muscle contraction.       Manual tx: Supine LE/Lumbar stretches (11 min.). R sidelying lumbar mobility assessment. No tenderness or pain reported. STM to lumbar paraspinals.           PT Long Term Goals - 07/01/17 1423      PT LONG TERM GOAL #1   Title Pt will score <30% on Modified Oswestry to increase self percieved mobility   Baseline 8/8: 42%.  MODI: 22% on 9/5.    Time 4   Period Weeks   Status Achieved     PT LONG TERM GOAL #2   Title Pt. will report no lumbar tightness with work-related tasks to improve pain-free mobility.     Baseline no c/o pain or tightness in low back today   Time 4   Period Weeks   Status Achieved     PT LONG TERM GOAL #3   Title Pt will demonstrate proper mechanics with floor to waist lifting/ B carrying of >20 lbs to improve functional mobility.     Baseline good upright body mechances/ B LE support   Time 4   Period Weeks   Status Achieved     PT LONG TERM GOAL #4   Title Pt will be able to ambulate 2 miles  with no c/o pain to promote return to regular exercise program   Baseline returned to hiking and will continue towards gym based ex.    Time 4   Period Weeks   Status Partially Met            Plan - 07/01/17 1420    Clinical Impression Statement Pt. is doing well and has progressed to an independent understanding of HEP.  PT recommends discharge from PT at this time with focus on progressive HEP/ massage therapy for any muscle tightness.  No pain/tenderness/tightness noted in low back during tx. session today.  Pt. has progressed well with PT goals.  Discharge from PT at this time.     Clinical Presentation Stable   Clinical Decision Making Moderate   Clinical Impairments Affecting Rehab Potential Positive: Age, Previous response to therapy, Active. Negative: Chronic   PT Frequency 2x / week   PT Duration 4 weeks   PT  Treatment/Interventions ADLs/Self Care Home Management;Aquatic Therapy;Cryotherapy;Electrical Stimulation;Moist Heat;Traction;Gait training;Stair training;Functional mobility training;Therapeutic activities;Therapeutic exercise;Balance training;Neuromuscular re-education;Patient/family education;Manual techniques   PT Next Visit Plan Discharge from PT at this time.  Pt. instructed to contact PT if any issues over next weeks/months.     PT Home Exercise Plan TrA activation/progressive exercise program with piriformis/hamstring stretching program   Consulted and Agree with Plan of Care Patient      Patient will benefit from skilled therapeutic intervention in order to improve the following deficits and impairments:  Decreased activity tolerance, Decreased mobility, Decreased range of motion, Decreased strength, Hypomobility, Increased muscle spasms, Impaired flexibility, Improper body mechanics, Pain  Visit Diagnosis: Difficulty in walking, not elsewhere classified  Muscle weakness (generalized)  Abnormal posture  Chronic midline low back pain without sciatica     Problem List Patient Active Problem List   Diagnosis Date Noted  . Vitamin D deficiency 12/19/2015   Pura Spice, PT, DPT # [phone] 07/01/2017, 2:28 PM  [place] [facility] [address] [place], [place], [address] Phone: [phone]   Fax:  [phone]  Name: Jordan Peters MRN: [id number] Date of Birth: 06/16/1966

## 2017-10-13 ENCOUNTER — Other Ambulatory Visit: Payer: Self-pay | Admitting: Neurosurgery

## 2017-10-13 DIAGNOSIS — M5416 Radiculopathy, lumbar region: Secondary | ICD-10-CM

## 2017-12-22 ENCOUNTER — Other Ambulatory Visit: Payer: Self-pay | Admitting: Family Medicine

## 2017-12-22 DIAGNOSIS — Z1231 Encounter for screening mammogram for malignant neoplasm of breast: Secondary | ICD-10-CM

## 2017-12-25 DIAGNOSIS — R112 Nausea with vomiting, unspecified: Secondary | ICD-10-CM

## 2017-12-25 DIAGNOSIS — Z9889 Other specified postprocedural states: Secondary | ICD-10-CM

## 2017-12-25 HISTORY — DX: Nausea with vomiting, unspecified: R11.2

## 2017-12-25 HISTORY — DX: Other specified postprocedural states: Z98.890

## 2018-01-06 IMAGING — RF DG C-ARM 61-120 MIN
1 series · 1 of 1 positions shown · non-contrast
Comparison: None.

CLINICAL DATA: Intraoperative localization film.

EXAM:
DG C-ARM 61-120 MIN

[Series 1: run · 1 of 1 slices shown]
[im 1/1]
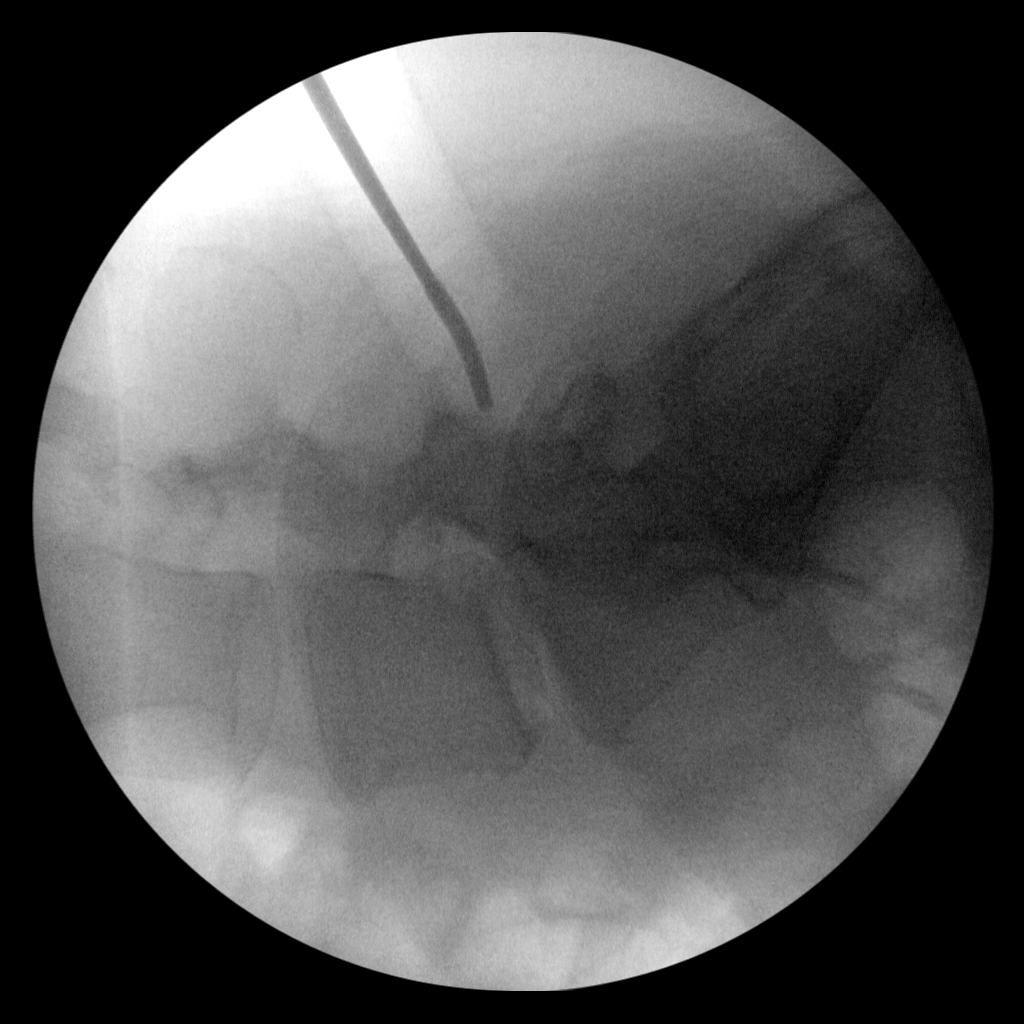

[1 of 1 positions shown; findings below may reference images not displayed]

FINDINGS: Probe is identified at what appears to be the last fully open disc
space. Exact level cannot be determined without preoperative
imaging.
IMPRESSION: As above.

## 2018-01-18 ENCOUNTER — Encounter: Payer: Managed Care, Other (non HMO) | Admitting: Certified Nurse Midwife

## 2018-01-20 ENCOUNTER — Ambulatory Visit
Admission: RE | Admit: 2018-01-20 | Discharge: 2018-01-20 | Disposition: A | Discharge status: Home / Self Care | Payer: BLUE CROSS/BLUE SHIELD | Source: Ambulatory Visit | Attending: Family Medicine | Admitting: Family Medicine

## 2018-01-20 DIAGNOSIS — Z1231 Encounter for screening mammogram for malignant neoplasm of breast: Secondary | ICD-10-CM

## 2018-03-04 ENCOUNTER — Other Ambulatory Visit: Payer: Self-pay

## 2018-03-04 ENCOUNTER — Encounter
Admission: RE | Admit: 2018-03-04 | Discharge: 2018-03-04 | Disposition: A | Discharge status: Home / Self Care | Payer: BLUE CROSS/BLUE SHIELD | Source: Ambulatory Visit | Attending: Obstetrics and Gynecology | Admitting: Obstetrics and Gynecology

## 2018-03-04 DIAGNOSIS — Z01812 Encounter for preprocedural laboratory examination: Secondary | ICD-10-CM | POA: Insufficient documentation

## 2018-03-04 LAB — TYPE AND SCREEN
ABO/RH(D): O POS
Antibody Screen: NEGATIVE

## 2018-03-04 NOTE — H&P (Signed)
Chief Complaint:     Jordan Peters is a 52 y.o. female here for scheduled surgery  ' HPI:  Pt presents for a preoperative visit to schedule a D&C, hysteroscopy, and novasure ablation.  She has a hx of: abnormal bleeding, unresponsive to conservative measures  Workup has included: The patient underwent a preliminary transvaginal pelvic sonogram. - Uterus: 9x5x3cm with a heterogenous 8.78mm stripe (period is due tomorrow) - Ovaries: volum L 2.3 and volume right 3.5 - Other: no fibroids, polyps seen  EMBx:  Diagnosis:  ENDOMETRIUM, BIOPSY:  MENSTRUAL ENDOMETRIUM. NO HYPERPLASIA OR CARCINOMA.   Past Medical History:  has a past medical history of Anxiety, Back pain, Clostridium difficile diarrhea, Colon polyp, Diverticulosis, GERD (gastroesophageal reflux disease), Headache, IBS (irritable bowel syndrome), Lumbar herniated disc, and Tremor.  Past Surgical History:  has a past surgical history that includes uterine cyst; Colonoscopy; Dilation and curettage of uterus; Esophagogastroduodenoscopy endoscopy; Polypectomy; and Lumbar laminectomy/decompression microdiscectomy (Right, 02/25/2017). Family History: family history includes Breast cancer in her paternal aunt; Cancer in her father; Diabetes in her father; Hypertension in her father. Social History:  reports that she has never smoked. She has never used smokeless tobacco. She reports that she drinks alcohol. She reports that she does not use drugs. OB/GYN History:  OB History    Gravida  2   Para      Term      Preterm      AB      Living  2     SAB      TAB      Ectopic      Multiple      Live Births  2           Allergies: is allergic to ibuprofen and sulfa antibiotics. Medications: No current facility-administered medications for this encounter.   Current Outpatient Medications:  .  cholestyramine (QUESTRAN) 4 GM/DOSE powder, Take 1 dose as needed for diarrhea, Disp: , Rfl: 1 .  clonazePAM (KLONOPIN) 0.5  MG tablet, Take 1 tablet (0.5 mg total) by mouth 2 (two) times daily as needed (muscle spasm). (Patient taking differently: Take 0.5 mg by mouth daily as needed for anxiety. ), Disp: 20 tablet, Rfl: 0 .  FLUoxetine (PROZAC) 10 MG capsule, Take 10 mg by mouth daily., Disp: , Rfl:  .  fluticasone (FLONASE) 50 MCG/ACT nasal spray, Place 1 spray into both nostrils daily as needed for allergies. , Disp: , Rfl:  .  Melatonin 5 MG TABS, Take 2.5 mg by mouth at bedtime. , Disp: , Rfl:  .  Multiple Vitamin (MULTIVITAMIN WITH MINERALS) TABS tablet, Take 1 tablet by mouth daily., Disp: , Rfl:  .  phentermine 15 MG capsule, Take 15 mg by mouth daily., Disp: , Rfl:  .  Probiotic Product (PROBIOTIC DAILY) CAPS, Take 1 capsule by mouth daily. , Disp: , Rfl:  .  tretinoin (RETIN-A) 0.05 % cream, APPLY PEA SIZED AMOUNT TO FACE NIGHTLY 20 MIN AFTER WASHING, Disp: , Rfl:  .  NON FORMULARY, Take 1 capsule by mouth 3 (three) times daily with meals. BIO x4 As needed for digestion, Disp: , Rfl:  .  OVER THE COUNTER MEDICATION, Take 1 capsule by mouth 3 (three) times daily before meals., Disp: , Rfl:   Review of Systems: No SOB, no palpitations or chest pain, no new lower extremity edema, no nausea or vomiting or bowel or bladder complaints. See HPI for gyn specific ROS.   Exam:   There were no  vitals filed for this visit.  WDWN   female in NAD There is no height or weight on file to calculate BMI.  General: Patient is well-groomed, well-nourished, appears stated age in no acute distress   HEENT: head is atraumatic and normocephalic, trachea is midline, neck is supple with no palpable nodules   CV: Regular rhythm and normal heart rate, no murmur   Pulm: Clear to auscultation throughout lung fields with no wheezing, crackles, or rhonchi. No increased work of breathing  Abdomen: soft , no mass, non-tender, no rebound tenderness, no hepatomegaly  Pelvic: Deferred  Impression:   Menometrorrhagia   Plan:    -  Preoperative visit: D&C hysteroscopy, and novasure ablation. Consents signed today. Risks of surgery were discussed with the patient including but not limited to: bleeding which may require transfusion; infection which may require antibiotics; injury to uterus or surrounding organs; intrauterine scarring which may impair future fertility; need for additional procedures including laparotomy or laparoscopy; and other postoperative/anesthesia complications. Written informed consent was obtained.  This is a scheduled same-day surgery. She will have a postop visit in 2 weeks to review operative findings and pathology.   Christeen Douglas, MD

## 2018-03-04 NOTE — Patient Instructions (Signed)
Your procedure is scheduled on: Mar 12, 2018 FRIDAY Report to Day Surgery on the 2nd floor of the [facility]. To find out your arrival time, please call [phone] between 1PM - 3PM on: Thursday Mar 11, 2018  REMEMBER: Instructions that are not followed completely may result in serious medical risk, up to and including death; or upon the discretion of your surgeon and anesthesiologist your surgery may need to be rescheduled.  Do not eat food after midnight the night before your procedure.  No gum chewing, lozengers or hard candies.  You may however, drink CLEAR liquids up to 2 hours before you are scheduled to arrive for your surgery. Do not drink anything within 2 hours of the start of your surgery.  Clear liquids include: - water  - apple juice without pulp - clear gatorade - black coffee or tea (Do NOT add anything to the coffee or tea) Do NOT drink anything that is not on this list.  Type 1 and Type 2 diabetics should only drink water.  No Alcohol for 24 hours before or after surgery.  No Smoking including e-cigarettes for 24 hours prior to surgery.  No chewable tobacco products for at least 6 hours prior to surgery.  No nicotine patches on the day of surgery.  On the morning of surgery brush your teeth with toothpaste and water, you may rinse your mouth with mouthwash if you wish. Do not swallow any toothpaste or mouthwash.  Notify your doctor if there is any change in your medical condition (cold, fever, infection).  Do not wear jewelry, make-up, hairpins, clips or nail polish.  Do not wear lotions, powders, or perfumes. You may wear deodorant.  Do not shave 48 hours prior to surgery. Men may shave face and neck.  Contacts and dentures may not be worn into surgery.  Do not bring valuables to the hospital, including drivers license, insurance or credit cards.  [facility] is not responsible for any belongings or valuables.   TAKE THESE MEDICATIONS THE MORNING OF  SURGERY: PROZAC KLONOPIN IF NEEDED   USE FLONASE   SHOWER THE DAY OF SURGERY WITH ANTIBACTERIAL SOAP  Stop Anti-inflammatories (NSAIDS) such as Advil, Aleve, Ibuprofen, Motrin, Naproxen, Naprosyn and Aspirin based products such as Excedrin, Goodys Powder, BC Powder. (May take Tylenol or Acetaminophen if needed.)  STOP PHENTERMINE 5 DAYS BEFORE SURGERY  Stop ANY OVER THE COUNTER supplements until after surgery. LAST DOSE MAY 10. (May continue Vitamin D, Vitamin B)  Wear comfortable clothing (specific to your surgery type) to the hospital.  If you are being discharged the day of surgery, you will not be allowed to drive home. You will need a responsible adult to drive you home and stay with you that night.   If you are taking public transportation, you will need to have a responsible adult with you. Please confirm with your physician that it is acceptable to use public transportation.   Please call [phone] if you have any questions about these instructions.

## 2018-03-05 ENCOUNTER — Inpatient Hospital Stay: Admission: RE | Admit: 2018-03-05 | Payer: BLUE CROSS/BLUE SHIELD | Source: Ambulatory Visit

## 2018-03-12 ENCOUNTER — Ambulatory Visit: Payer: BLUE CROSS/BLUE SHIELD | Admitting: Certified Registered Nurse Anesthetist

## 2018-03-12 ENCOUNTER — Other Ambulatory Visit: Payer: Self-pay

## 2018-03-12 ENCOUNTER — Encounter
Admission: RE | Disposition: A | Discharge status: Home / Self Care | Payer: Self-pay | Source: Ambulatory Visit | Attending: Obstetrics and Gynecology

## 2018-03-12 ENCOUNTER — Encounter: Payer: Self-pay | Admitting: *Deleted

## 2018-03-12 ENCOUNTER — Ambulatory Visit
Admission: RE | Admit: 2018-03-12 | Discharge: 2018-03-12 | Disposition: A | Discharge status: Home / Self Care | Payer: BLUE CROSS/BLUE SHIELD | Source: Ambulatory Visit | Attending: Obstetrics and Gynecology | Admitting: Obstetrics and Gynecology

## 2018-03-12 DIAGNOSIS — Z79899 Other long term (current) drug therapy: Secondary | ICD-10-CM | POA: Diagnosis not present

## 2018-03-12 DIAGNOSIS — N939 Abnormal uterine and vaginal bleeding, unspecified: Secondary | ICD-10-CM | POA: Insufficient documentation

## 2018-03-12 DIAGNOSIS — F419 Anxiety disorder, unspecified: Secondary | ICD-10-CM | POA: Insufficient documentation

## 2018-03-12 DIAGNOSIS — Z7951 Long term (current) use of inhaled steroids: Secondary | ICD-10-CM | POA: Diagnosis not present

## 2018-03-12 HISTORY — PX: DILITATION & CURRETTAGE/HYSTROSCOPY WITH NOVASURE ABLATION: SHX5568

## 2018-03-12 LAB — POCT PREGNANCY, URINE: Preg Test, Ur: NEGATIVE

## 2018-03-12 LAB — ABO/RH: ABO/RH(D): O POS

## 2018-03-12 SURGERY — DILATATION & CURETTAGE/HYSTEROSCOPY WITH NOVASURE ABLATION
Anesthesia: General | Site: Uterus | Wound class: Clean Contaminated

## 2018-03-12 MED ORDER — PROPOFOL 10 MG/ML IV BOLUS
INTRAVENOUS | Status: DC | PRN
  Administered 2018-03-12: 50 mg via INTRAVENOUS

## 2018-03-12 MED ORDER — PROPOFOL 500 MG/50ML IV EMUL
INTRAVENOUS | Status: AC
  Filled 2018-03-12: qty 50

## 2018-03-12 MED ORDER — FAMOTIDINE 20 MG PO TABS
ORAL_TABLET | ORAL | Status: AC
  Filled 2018-03-12: qty 1

## 2018-03-12 MED ORDER — FAMOTIDINE 20 MG PO TABS
20.0000 mg | ORAL_TABLET | Freq: Once | ORAL | Status: AC
  Administered 2018-03-12: 20 mg via ORAL

## 2018-03-12 MED ORDER — PROPOFOL 10 MG/ML IV BOLUS
INTRAVENOUS | Status: AC
  Filled 2018-03-12: qty 20

## 2018-03-12 MED ORDER — ONDANSETRON HCL 4 MG/2ML IJ SOLN
INTRAMUSCULAR | Status: AC
  Administered 2018-03-12: 4 mg via INTRAVENOUS
  Filled 2018-03-12: qty 2

## 2018-03-12 MED ORDER — PROPOFOL 500 MG/50ML IV EMUL
INTRAVENOUS | Status: DC | PRN
  Administered 2018-03-12: 100 ug/kg/min via INTRAVENOUS

## 2018-03-12 MED ORDER — LACTATED RINGERS IV SOLN
INTRAVENOUS | Status: DC
  Administered 2018-03-12 (×2): via INTRAVENOUS

## 2018-03-12 MED ORDER — ONDANSETRON HCL 4 MG/2ML IJ SOLN
INTRAMUSCULAR | Status: DC | PRN
  Administered 2018-03-12: 4 mg via INTRAVENOUS

## 2018-03-12 MED ORDER — HYDROMORPHONE HCL 1 MG/ML IJ SOLN
INTRAMUSCULAR | Status: AC
  Filled 2018-03-12: qty 1

## 2018-03-12 MED ORDER — DEXAMETHASONE SODIUM PHOSPHATE 10 MG/ML IJ SOLN
INTRAMUSCULAR | Status: AC
Start: 2018-03-12 — End: ?
  Filled 2018-03-12: qty 1

## 2018-03-12 MED ORDER — PHENYLEPHRINE HCL 10 MG/ML IJ SOLN
INTRAMUSCULAR | Status: AC
  Filled 2018-03-12: qty 1

## 2018-03-12 MED ORDER — FENTANYL CITRATE (PF) 100 MCG/2ML IJ SOLN
INTRAMUSCULAR | Status: AC
  Filled 2018-03-12: qty 2

## 2018-03-12 MED ORDER — ONDANSETRON HCL 4 MG/2ML IJ SOLN
4.0000 mg | Freq: Once | INTRAMUSCULAR | Status: AC | PRN
  Administered 2018-03-12: 4 mg via INTRAVENOUS

## 2018-03-12 MED ORDER — FENTANYL CITRATE (PF) 100 MCG/2ML IJ SOLN: 25.0000 ug | INTRAMUSCULAR | Status: DC | PRN

## 2018-03-12 MED ORDER — FAMOTIDINE 20 MG PO TABS
ORAL_TABLET | ORAL | Status: AC
  Administered 2018-03-12: 20 mg via ORAL
  Filled 2018-03-12: qty 1

## 2018-03-12 MED ORDER — HYDROMORPHONE HCL 1 MG/ML IJ SOLN
INTRAMUSCULAR | Status: DC | PRN
  Administered 2018-03-12 (×5): .25 mg via INTRAVENOUS
  Administered 2018-03-12: 0.5 mg via INTRAVENOUS
  Administered 2018-03-12: .25 mg via INTRAVENOUS

## 2018-03-12 MED ORDER — LIDOCAINE HCL (CARDIAC) PF 100 MG/5ML IV SOSY
PREFILLED_SYRINGE | INTRAVENOUS | Status: DC | PRN
  Administered 2018-03-12: 100 mg via INTRAVENOUS

## 2018-03-12 MED ORDER — FENTANYL CITRATE (PF) 100 MCG/2ML IJ SOLN
INTRAMUSCULAR | Status: DC | PRN
  Administered 2018-03-12 (×4): 25 ug via INTRAVENOUS

## 2018-03-12 MED ORDER — ONDANSETRON HCL 4 MG/2ML IJ SOLN
INTRAMUSCULAR | Status: AC
  Filled 2018-03-12: qty 2

## 2018-03-12 MED ORDER — MIDAZOLAM HCL 2 MG/2ML IJ SOLN
INTRAMUSCULAR | Status: DC | PRN
  Administered 2018-03-12: 2 mg via INTRAVENOUS

## 2018-03-12 MED ORDER — DEXAMETHASONE SODIUM PHOSPHATE 10 MG/ML IJ SOLN
INTRAMUSCULAR | Status: DC | PRN
  Administered 2018-03-12: 10 mg via INTRAVENOUS

## 2018-03-12 MED ORDER — LIDOCAINE HCL (PF) 2 % IJ SOLN
INTRAMUSCULAR | Status: AC
  Filled 2018-03-12: qty 10

## 2018-03-12 MED ORDER — MIDAZOLAM HCL 2 MG/2ML IJ SOLN
INTRAMUSCULAR | Status: AC
  Filled 2018-03-12: qty 2

## 2018-03-12 SURGICAL SUPPLY — 21 items
CANISTER SUCT 1200ML W/VALVE (MISCELLANEOUS) ×3 IMPLANT
CATH ROBINSON RED A/P 16FR (CATHETERS) ×3 IMPLANT
CUP MEDICINE 2OZ PLAST GRAD ST (MISCELLANEOUS) ×3 IMPLANT
DRSG TELFA 3X8 NADH (GAUZE/BANDAGES/DRESSINGS) ×3 IMPLANT
GAUZE PETRO XEROFOAM 1X8 (MISCELLANEOUS) ×3 IMPLANT
GAUZE PETROLATUM 1 X8 (GAUZE/BANDAGES/DRESSINGS) ×3 IMPLANT
GLOVE BIO SURGEON STRL SZ7 (GLOVE) ×9 IMPLANT
GLOVE INDICATOR 7.5 STRL GRN (GLOVE) ×12 IMPLANT
GOWN STRL REUS W/ TWL LRG LVL3 (GOWN DISPOSABLE) ×3 IMPLANT
GOWN STRL REUS W/TWL LRG LVL3 (GOWN DISPOSABLE) ×6
IV LACTATED RINGERS 1000ML (IV SOLUTION) ×3 IMPLANT
KIT TURNOVER CYSTO (KITS) ×3 IMPLANT
NOVASURE ENDOMETRIAL ABLATION (MISCELLANEOUS) ×3 IMPLANT
NS IRRIG 500ML POUR BTL (IV SOLUTION) ×3 IMPLANT
PACK DNC HYST (MISCELLANEOUS) ×3 IMPLANT
PAD OB MATERNITY 4.3X12.25 (PERSONAL CARE ITEMS) ×3 IMPLANT
PAD PREP 24X41 OB/GYN DISP (PERSONAL CARE ITEMS) ×3 IMPLANT
SYR 10ML LL (SYRINGE) ×3 IMPLANT
TOWEL OR 17X26 4PK STRL BLUE (TOWEL DISPOSABLE) ×3 IMPLANT
TUBING CONNECTING 10 (TUBING) ×2 IMPLANT
TUBING CONNECTING 10' (TUBING) ×1

## 2018-03-12 NOTE — Discharge Instructions (Signed)
Discharge instructions after a hysteroscopy with dilation and curettage  Signs and Symptoms to Report  Call our office at [phone] if you have any of the following:   . Fever over 100.4 degrees or higher . Severe stomach pain not relieved with pain medications . Bright red bleeding that's heavier than a period that does not slow with rest after the first 24 hours . To go the bathroom a lot (frequency), you can't hold your urine (urgency), or it hurts when you empty your bladder (urinate) . Chest pain . Shortness of breath . Pain in the calves of your legs . Severe nausea and vomiting not relieved with anti-nausea medications . Any concerns  What You Can Expect after Surgery . You may see some pink tinged, bloody fluid. This is normal. You may also have cramping for several days.   Activities after Your Discharge Follow these guidelines to help speed your recovery at home: . Don't drive if you are in pain or taking narcotic pain medicine. You may drive when you can safely slam on the brakes, turn the wheel forcefully, and rotate your torso comfortably. This is typically 4-7 days. Practice in a parking lot or side street prior to attempting to drive regularly.  . Ask others to help with household chores for 4 weeks. . Don't do strenuous activities, exercises, or sports like vacuuming, tennis, squash, etc. until your doctor says it is safe to do so. . Walk as you feel able. Rest often since it may take a week or two for your energy level to return to normal.  . You may climb stairs . Avoid constipation:   -Eat fruits, vegetables, and whole grains. Eat small meals as your appetite will take time to return to normal.   -Drink 6 to 8 glasses of water each day unless your doctor has told you to limit your fluids.   -Use a laxative or stool softener as needed if constipation becomes a problem. You may take Miralax, metamucil, Citrucil, Colace, Senekot, FiberCon, etc. If this does not  relieve the constipation, try two tablespoons of Milk Of Magnesia every 8 hours until your bowels move.  . You may shower.  . Do not get in a hot tub, swimming pool, etc. until your doctor agrees. . Do not douche, use tampons, or have sex until your doctor says it is okay, usually about 2 weeks. . Take your pain medicine when you need it. The medicine may not work as well if the pain is bad.  Take the medicines you were taking before surgery. Other medications you might need are pain medications (ibuprofen), medications for constipation (Colace) and nausea medications (Zofran).        AMBULATORY SURGERY  DISCHARGE INSTRUCTIONS   1) The drugs that you were given will stay in your system until tomorrow so for the next 24 hours you should not:  A) Drive an automobile B) Make any legal decisions C) Drink any alcoholic beverage   2) You may resume regular meals tomorrow.  Today it is better to start with liquids and gradually work up to solid foods.  You may eat anything you prefer, but it is better to start with liquids, then soup and crackers, and gradually work up to solid foods.   3) Please notify your doctor immediately if you have any unusual bleeding, trouble breathing, redness and pain at the surgery site, drainage, fever, or pain not relieved by medication.    4) Additional Instructions:          Please contact your physician with any problems or Same Day Surgery at [phone], Monday through Friday 6 am to 4 pm, or [place] [facility] [place] [facility] number at [phone]. 

## 2018-03-12 NOTE — Anesthesia Preprocedure Evaluation (Signed)
Anesthesia Evaluation  Patient identified by MRN, date of birth, ID band Patient awake    Reviewed: Allergy & Precautions, NPO status , Patient's Chart, lab work & pertinent test results  History of Anesthesia Complications Negative for: history of anesthetic complications  Airway Mallampati: II       Dental   Pulmonary neg sleep apnea, neg COPD,           Cardiovascular (-) hypertension(-) Past MI and (-) CHF (-) dysrhythmias (-) Valvular Problems/Murmurs     Neuro/Psych neg Seizures Anxiety    GI/Hepatic Neg liver ROS, GERD (none since pregnancy)  ,  Endo/Other  neg diabetes  Renal/GU negative Renal ROS     Musculoskeletal   Abdominal   Peds  Hematology   Anesthesia Other Findings   Reproductive/Obstetrics 6 weeks post-partum                             Anesthesia Physical Anesthesia Plan  ASA: II  Anesthesia Plan: General   Post-op Pain Management:    Induction:   PONV Risk Score and Plan: 3 and Dexamethasone, Ondansetron and Midazolam  Airway Management Planned: LMA  Additional Equipment:   Intra-op Plan:   Post-operative Plan:   Informed Consent: I have reviewed the patients History and Physical, chart, labs and discussed the procedure including the risks, benefits and alternatives for the proposed anesthesia with the patient or authorized representative who has indicated his/her understanding and acceptance.     Plan Discussed with:   Anesthesia Plan Comments:         Anesthesia Quick Evaluation

## 2018-03-12 NOTE — Op Note (Signed)
Operative Report Hysteroscopy with Dilation and Curettage; Novasure ablation   Indications: Abnormal uterine bleeding   Pre-operative Diagnosis: Abnormal uterine bleeding, failed conservative management  Post-operative Diagnosis: same.  Procedure: 1. Exam under anesthesia 2. Fractional D&C with endocervical curettage  3. Hysteroscopy 4. Novasure endometrial ablation  Surgeon: Cline Cools, MD  Assistant(s):  None  Anesthesia: General LMA anesthesia  Anesthesiologist: Yevette Edwards, MD Anesthesiologist: Yevette Edwards, MD CRNA: Clovis Fredrickson, CRNA  Estimated Blood Loss:  Minimal         Intraoperative medications: none         Total IV Fluids:  Urine Output: 20ml         Specimens: Endocervical curettings, endometrial curettings         Complications:  None; patient tolerated the procedure well.         Disposition: PACU - hemodynamically stable.         Condition: stable  Findings: Uterus measuring 9 cm by sound; normal cervix, vagina, perineum. Cervical length: 5 cm Uterine cavity length: 4 cm Uterine cavity width: 2.5 cm Power in watts: 41 Total time: 120seconds  Indication for procedure/Consents: 52 y.o. G2P0  here for scheduled surgery for the aforementioned diagnoses.   Risks of surgery were discussed with the patient including but not limited to: bleeding which may require transfusion; infection which may require antibiotics; injury to uterus or surrounding organs; intrauterine scarring which may impair future fertility; need for additional procedures including laparotomy or laparoscopy; and other postoperative/anesthesia complications. Written informed consent was obtained.    Procedure Details:   The patient was taken to the operating room where LMA anesthesia was administered and was found to be adequate. After a formal and adequate timeout was performed, she was placed in the dorsal lithotomy position and examined with the above  findings. She was then prepped and draped in the sterile manner. Her bladder was catheterized for an estimated amount of clear, yellow urine. A weighed speculum was then placed in the patient's vagina and a single tooth tenaculum was applied to the anterior lip of the cervix.  An endocervical currettage was performed. Her cervix was serially dilated to 15 [place] using Hanks dilators.The hysteroscope was introduced to reveal the above findings.The hysteroscope was also used to determine the level of the internal os, and measurements were confirmed. The uterine cavity was carefully examined, both ostia were recognized, and diffusely proliferative endometrium with polypoid fragments was noted.  A sharp curettage was then performed until there was a gritty texture in all four quadrants.   NOVASURE PROCEDURE DETAILS:   The cervix was further dilated to accommodate the NovaSure device.  The NovaSure device was inserted, and the cavity width was determined. Using a power of 41 watts, for 120 sec, the endometrial ablation was performed. The hysteroscope was not re-introduced into the uterine cavity, to decrease the risk of pelvic infection. The tenaculum was removed from the anterior lip of the cervix, and the vaginal speculum was removed after noting good hemostasis.  She received iv acetaminophen and Toradol prior to leaving the OR. The patient tolerated the procedure well and was taken to the recovery area awake, extubated and in stable condition.  The patient will be discharged to home as per PACU criteria.  Routine postoperative instructions given.  She was prescribed Percocet, Ibuprofen and Colace.  She will follow up in the clinic in two weeks for postoperative evaluation.

## 2018-03-12 NOTE — Interval H&P Note (Signed)
History and Physical Interval Note:  03/12/2018 6:30 PM  Jordan Peters  has presented today for surgery, with the diagnosis of abnormal uterine bleeding  The various methods of treatment have been discussed with the patient and family. After consideration of risks, benefits and other options for treatment, the patient has consented to  Procedure(s): DILATATION & CURETTAGE/HYSTEROSCOPY WITH NOVASURE ABLATION (N/A) as a surgical intervention .  The patient's history has been reviewed, patient examined, no change in status, stable for surgery.  I have reviewed the patient's chart and labs.  Questions were answered to the patient's satisfaction.     Christeen Douglas

## 2018-03-13 ENCOUNTER — Encounter: Payer: Self-pay | Admitting: Obstetrics and Gynecology

## 2018-03-14 ENCOUNTER — Encounter: Payer: Self-pay | Admitting: Obstetrics and Gynecology

## 2018-03-14 NOTE — Anesthesia Post-op Follow-up Note (Signed)
Anesthesia QCDR form completed.        

## 2018-03-14 NOTE — Transfer of Care (Signed)
Immediate Anesthesia Transfer of Care Note  Patient: Jordan Peters  Procedure(s) Performed: fractional DILATATION & CURETTAGE/HYSTEROSCOPY WITH NOVASURE ABLATION (N/A Uterus)  Patient Location: PACU  Anesthesia Type:General  Level of Consciousness: awake, alert  and oriented  Airway & Oxygen Therapy: Patient Spontanous Breathing and Patient connected to face mask oxygen  Post-op Assessment: Report given to RN and Post -op Vital signs reviewed and stable  Post vital signs: Reviewed and stable  Last Vitals:  Vitals Value Taken Time  BP    Temp    Pulse    Resp    SpO2      Last Pain:  Vitals:   03/12/18 2129  TempSrc: Temporal  PainSc: 0-No pain         Complications: No apparent anesthesia complications

## 2018-03-15 NOTE — Anesthesia Postprocedure Evaluation (Signed)
Anesthesia Post Note  Patient: Jordan Peters  Procedure(s) Performed: fractional DILATATION & CURETTAGE/HYSTEROSCOPY WITH NOVASURE ABLATION (N/A Uterus)  Patient location during evaluation: PACU Anesthesia Type: General Level of consciousness: awake and alert Pain management: pain level controlled Vital Signs Assessment: post-procedure vital signs reviewed and stable Respiratory status: spontaneous breathing, nonlabored ventilation, respiratory function stable and patient connected to nasal cannula oxygen Cardiovascular status: blood pressure returned to baseline and stable Postop Assessment: no apparent nausea or vomiting Anesthetic complications: no     Last Vitals:  Vitals:   03/12/18 2059 03/12/18 2129  BP: 104/69 126/83  Pulse: 78 65  Resp: 17 18  Temp: 36.4 C 36.6 C  SpO2: 97% 97%    Last Pain:  Vitals:   03/12/18 2129  TempSrc: Temporal  PainSc: 0-No pain                 Yevette Edwards

## 2018-03-17 LAB — SURGICAL PATHOLOGY

## 2018-05-05 ENCOUNTER — Ambulatory Visit
Admission: RE | Admit: 2018-05-05 | Discharge: 2018-05-05 | Disposition: A | Discharge status: Home / Self Care | Payer: BLUE CROSS/BLUE SHIELD | Source: Ambulatory Visit | Attending: Family Medicine | Admitting: Family Medicine

## 2018-05-05 ENCOUNTER — Other Ambulatory Visit: Payer: Self-pay | Admitting: Family Medicine

## 2018-05-05 DIAGNOSIS — R05 Cough: Secondary | ICD-10-CM

## 2018-05-05 DIAGNOSIS — R059 Cough, unspecified: Secondary | ICD-10-CM

## 2018-06-16 ENCOUNTER — Other Ambulatory Visit: Payer: Self-pay | Admitting: Student

## 2018-06-16 DIAGNOSIS — M5442 Lumbago with sciatica, left side: Secondary | ICD-10-CM

## 2018-06-17 ENCOUNTER — Other Ambulatory Visit: Payer: Self-pay | Admitting: Student

## 2018-06-17 DIAGNOSIS — M5442 Lumbago with sciatica, left side: Secondary | ICD-10-CM

## 2018-06-17 DIAGNOSIS — M5416 Radiculopathy, lumbar region: Secondary | ICD-10-CM

## 2018-06-30 ENCOUNTER — Ambulatory Visit
Admission: RE | Admit: 2018-06-30 | Discharge: 2018-06-30 | Disposition: A | Discharge status: Home / Self Care | Payer: BLUE CROSS/BLUE SHIELD | Source: Ambulatory Visit | Attending: Student | Admitting: Student

## 2018-06-30 DIAGNOSIS — M5442 Lumbago with sciatica, left side: Secondary | ICD-10-CM

## 2018-06-30 DIAGNOSIS — M5416 Radiculopathy, lumbar region: Secondary | ICD-10-CM

## 2018-07-06 ENCOUNTER — Ambulatory Visit: Payer: BLUE CROSS/BLUE SHIELD

## 2018-07-28 ENCOUNTER — Other Ambulatory Visit: Payer: Self-pay

## 2018-07-28 ENCOUNTER — Encounter
Admission: RE | Admit: 2018-07-28 | Discharge: 2018-07-28 | Disposition: A | Discharge status: Home / Self Care | Payer: BLUE CROSS/BLUE SHIELD | Source: Ambulatory Visit | Attending: Neurosurgery | Admitting: Neurosurgery

## 2018-07-28 DIAGNOSIS — Z882 Allergy status to sulfonamides status: Secondary | ICD-10-CM | POA: Diagnosis not present

## 2018-07-28 DIAGNOSIS — Z886 Allergy status to analgesic agent status: Secondary | ICD-10-CM | POA: Insufficient documentation

## 2018-07-28 DIAGNOSIS — Z01810 Encounter for preprocedural cardiovascular examination: Secondary | ICD-10-CM | POA: Insufficient documentation

## 2018-07-28 DIAGNOSIS — M5416 Radiculopathy, lumbar region: Secondary | ICD-10-CM | POA: Insufficient documentation

## 2018-07-28 HISTORY — DX: Cardiac murmur, unspecified: R01.1

## 2018-07-28 HISTORY — DX: Nausea with vomiting, unspecified: R11.2

## 2018-07-28 HISTORY — DX: Other specified postprocedural states: Z98.890

## 2018-07-28 LAB — APTT: aPTT: 27 seconds (ref 24–36)

## 2018-07-28 LAB — URINALYSIS, ROUTINE W REFLEX MICROSCOPIC
BACTERIA UA: NONE SEEN
BILIRUBIN URINE: NEGATIVE
Glucose, UA: NEGATIVE mg/dL
HGB URINE DIPSTICK: NEGATIVE
KETONES UR: NEGATIVE mg/dL
LEUKOCYTES UA: NEGATIVE
NITRITE: NEGATIVE
PROTEIN: NEGATIVE mg/dL
SQUAMOUS EPITHELIAL / LPF: NONE SEEN (ref 0–5)
Specific Gravity, Urine: 1.026 (ref 1.005–1.030)
WBC, UA: NONE SEEN WBC/hpf (ref 0–5)
pH: 5 (ref 5.0–8.0)

## 2018-07-28 LAB — BASIC METABOLIC PANEL
Anion gap: 9 (ref 5–15)
BUN: 12 mg/dL (ref 6–20)
CHLORIDE: 106 mmol/L (ref 98–111)
CO2: 23 mmol/L (ref 22–32)
CREATININE: 0.61 mg/dL (ref 0.44–1.00)
Calcium: 9 mg/dL (ref 8.9–10.3)
GFR calc Af Amer: >60 mL/min (ref >60)
GFR calc non Af Amer: >60 mL/min (ref >60)
Glucose, Bld: 87 mg/dL (ref 70–99)
Potassium: 3.9 mmol/L (ref 3.5–5.1)
SODIUM: 138 mmol/L (ref 135–145)

## 2018-07-28 LAB — CBC
HCT: 39.6 % (ref 35.0–47.0)
HEMOGLOBIN: 13.9 g/dL (ref 12.0–16.0)
MCH: 30 pg (ref 26.0–34.0)
MCHC: 35 g/dL (ref 32.0–36.0)
MCV: 85.6 fL (ref 80.0–100.0)
Platelets: 314 10*3/uL (ref 150–440)
RBC: 4.63 MIL/uL (ref 3.80–5.20)
RDW: 13.9 % (ref 11.5–14.5)
WBC: 9.8 10*3/uL (ref 3.6–11.0)

## 2018-07-28 LAB — PROTIME-INR
INR: 0.99
PROTHROMBIN TIME: 13 s (ref 11.4–15.2)

## 2018-07-28 NOTE — Pre-Procedure Instructions (Signed)
UA results faxed to Dr. Lucienne Capers office.

## 2018-07-28 NOTE — Pre-Procedure Instructions (Signed)
EKG COMPARED WITH OLD 

## 2018-07-28 NOTE — Patient Instructions (Signed)
  Your procedure is scheduled on: Wednesday August 04, 2018 Report to Same Day Surgery 2nd floor medical mall (Medical Mall Entrance-take elevator on left to 2nd floor.  Check in with surgery information desk.) To find out your arrival time please call [phone] between 1PM - 3PM on Tuesday August 03, 2018  Remember: Instructions that are not followed completely may result in serious medical risk, up to and including death, or upon the discretion of your surgeon and anesthesiologist your surgery may need to be rescheduled.    _x___ 1. Do not eat food (including mints, candies, chewing gum) after midnight the night before your procedure. You may drink clear liquids up to 2 hours before you are scheduled to arrive at the hospital for your procedure.  Do not drink clear liquids within 2 hours of your scheduled arrival to the hospital.  Clear liquids include  --Water or Apple juice without pulp  --Clear carbohydrate beverage such as Gatorade  --Black Coffee or Clear Tea (No milk, no creamers, do not add anything to the coffee or tea)    __x__ 2. No Alcohol for 24 hours before or after surgery.   __x__ 3. No Smoking or e-cigarettes for 24 prior to surgery.  Do not use any chewable tobacco products for at least 6 hour prior to surgery   __x__ 4. Notify your doctor if there is any change in your medical condition (cold, fever, infections).   __x__ 5. On the morning of surgery brush your teeth with toothpaste and water.  You may rinse your mouth with mouth wash if you wish.  Do not swallow any toothpaste or mouthwash.  Please read over the following fact sheets that you were given:   [facility] Preparing for Surgery and or MRSA Information    __x__ Use CHG Soap or sage wipes as directed on instruction sheet    Do not wear jewelry, make-up, hairpins, clips or nail polish.  Do not wear lotions, powders, deodorant, or perfumes.   Do not shave below the face/neck 48 hours prior to surgery.    Do not bring valuables to the hospital.    [facility] is not responsible for any belongings or valuables.               Contacts, dentures or bridgework may not be worn into surgery.  For patients discharged on the day of surgery, you will NOT be permitted to drive yourself home.   _x___ Take anti-hypertensive listed below, cardiac, seizure, asthma, anti-reflux and psychiatric medicines. These include:  1. Fluoxetine (Prozac)  2. Clonazepam (Klonopin) if needed  _x___ Use inhalers on the day of surgery and bring to hospital day of surgery.  _x___ Follow recommendations from Cardiologist, Pulmonologist or PCP regarding stopping Aspirin, Coumadin, Plavix ,Eliquis, Effient, or Pradaxa, and Pletal.  _x___ Stop Anti-inflammatories such as Advil, Aleve, Ibuprofen, Motrin, Naproxen, Naprosyn, Goodies powders or aspirin products. OK to take Tylenol and Celebrex.   _x___ NOW: Stop supplements (Probiotic, Melatonin) until after surgery.  But may continue Vitamin D, Vitamin B, and multivitamin.

## 2018-08-04 ENCOUNTER — Other Ambulatory Visit: Payer: Self-pay

## 2018-08-04 ENCOUNTER — Ambulatory Visit: Payer: BLUE CROSS/BLUE SHIELD | Admitting: Registered Nurse

## 2018-08-04 ENCOUNTER — Encounter
Admission: RE | Disposition: A | Discharge status: Home / Self Care | Payer: Self-pay | Source: Ambulatory Visit | Attending: Neurosurgery

## 2018-08-04 ENCOUNTER — Ambulatory Visit: Payer: BLUE CROSS/BLUE SHIELD

## 2018-08-04 ENCOUNTER — Ambulatory Visit
Admission: RE | Admit: 2018-08-04 | Discharge: 2018-08-04 | Disposition: A | Discharge status: Home / Self Care | Payer: BLUE CROSS/BLUE SHIELD | Source: Ambulatory Visit | Attending: Neurosurgery | Admitting: Neurosurgery

## 2018-08-04 DIAGNOSIS — K219 Gastro-esophageal reflux disease without esophagitis: Secondary | ICD-10-CM | POA: Diagnosis not present

## 2018-08-04 DIAGNOSIS — K589 Irritable bowel syndrome without diarrhea: Secondary | ICD-10-CM | POA: Diagnosis not present

## 2018-08-04 DIAGNOSIS — Z882 Allergy status to sulfonamides status: Secondary | ICD-10-CM | POA: Diagnosis not present

## 2018-08-04 DIAGNOSIS — Z79899 Other long term (current) drug therapy: Secondary | ICD-10-CM | POA: Diagnosis not present

## 2018-08-04 DIAGNOSIS — F419 Anxiety disorder, unspecified: Secondary | ICD-10-CM | POA: Insufficient documentation

## 2018-08-04 DIAGNOSIS — M5116 Intervertebral disc disorders with radiculopathy, lumbar region: Secondary | ICD-10-CM | POA: Insufficient documentation

## 2018-08-04 DIAGNOSIS — Z419 Encounter for procedure for purposes other than remedying health state, unspecified: Secondary | ICD-10-CM

## 2018-08-04 HISTORY — PX: LUMBAR LAMINECTOMY/DECOMPRESSION MICRODISCECTOMY: SHX5026

## 2018-08-04 LAB — TYPE AND SCREEN
ABO/RH(D): O POS
ANTIBODY SCREEN: NEGATIVE

## 2018-08-04 LAB — POCT PREGNANCY, URINE: Preg Test, Ur: NEGATIVE

## 2018-08-04 SURGERY — LUMBAR LAMINECTOMY/DECOMPRESSION MICRODISCECTOMY 1 LEVEL
Anesthesia: General | Site: Back | Laterality: Left

## 2018-08-04 MED ORDER — PHENYLEPHRINE HCL 10 MG/ML IJ SOLN
INTRAMUSCULAR | Status: DC | PRN
  Administered 2018-08-04 (×2): 100 ug via INTRAVENOUS

## 2018-08-04 MED ORDER — FENTANYL CITRATE (PF) 100 MCG/2ML IJ SOLN: 25.0000 ug | INTRAMUSCULAR | Status: DC | PRN

## 2018-08-04 MED ORDER — FENTANYL CITRATE (PF) 100 MCG/2ML IJ SOLN
INTRAMUSCULAR | Status: AC
  Filled 2018-08-04: qty 2

## 2018-08-04 MED ORDER — KETAMINE HCL 50 MG/ML IJ SOLN
INTRAMUSCULAR | Status: DC | PRN
  Administered 2018-08-04: 40 mg via INTRAMUSCULAR
  Administered 2018-08-04: 10 mg via INTRAMUSCULAR

## 2018-08-04 MED ORDER — PROPOFOL 10 MG/ML IV BOLUS
INTRAVENOUS | Status: DC | PRN
  Administered 2018-08-04: 150 mg via INTRAVENOUS

## 2018-08-04 MED ORDER — FENTANYL CITRATE (PF) 100 MCG/2ML IJ SOLN
INTRAMUSCULAR | Status: DC | PRN
  Administered 2018-08-04: 100 ug via INTRAVENOUS
  Administered 2018-08-04: 50 ug via INTRAVENOUS

## 2018-08-04 MED ORDER — DEXAMETHASONE SODIUM PHOSPHATE 10 MG/ML IJ SOLN
INTRAMUSCULAR | Status: DC | PRN
  Administered 2018-08-04: 10 mg via INTRAVENOUS

## 2018-08-04 MED ORDER — PROPOFOL 500 MG/50ML IV EMUL
INTRAVENOUS | Status: AC
  Filled 2018-08-04: qty 50

## 2018-08-04 MED ORDER — PROPOFOL 500 MG/50ML IV EMUL
INTRAVENOUS | Status: DC | PRN
  Administered 2018-08-04: 50 ug/kg/min via INTRAVENOUS

## 2018-08-04 MED ORDER — METHYLPREDNISOLONE ACETATE 40 MG/ML IJ SUSP
INTRAMUSCULAR | Status: AC
  Filled 2018-08-04: qty 1

## 2018-08-04 MED ORDER — ONDANSETRON HCL 4 MG/2ML IJ SOLN
INTRAMUSCULAR | Status: AC
  Filled 2018-08-04: qty 2

## 2018-08-04 MED ORDER — SODIUM CHLORIDE 0.9 % IR SOLN
Status: DC | PRN
  Administered 2018-08-04: 1000 mL

## 2018-08-04 MED ORDER — CEFAZOLIN SODIUM-DEXTROSE 2-4 GM/100ML-% IV SOLN
INTRAVENOUS | Status: AC
  Filled 2018-08-04: qty 100

## 2018-08-04 MED ORDER — LIDOCAINE HCL (CARDIAC) PF 100 MG/5ML IV SOSY
PREFILLED_SYRINGE | INTRAVENOUS | Status: DC | PRN
  Administered 2018-08-04: 60 mg via INTRAVENOUS

## 2018-08-04 MED ORDER — EPHEDRINE SULFATE 50 MG/ML IJ SOLN
INTRAMUSCULAR | Status: DC | PRN
  Administered 2018-08-04 (×2): 10 mg via INTRAVENOUS

## 2018-08-04 MED ORDER — BUPIVACAINE LIPOSOME 1.3 % IJ SUSP
INTRAMUSCULAR | Status: AC
  Filled 2018-08-04: qty 20

## 2018-08-04 MED ORDER — METHOCARBAMOL 500 MG PO TABS: 500.0000 mg | ORAL_TABLET | Freq: Four times a day (QID) | ORAL | 0 refills | Status: DC | PRN

## 2018-08-04 MED ORDER — ONDANSETRON HCL 4 MG/2ML IJ SOLN: 4.0000 mg | Freq: Once | INTRAMUSCULAR | Status: DC | PRN

## 2018-08-04 MED ORDER — PROPOFOL 10 MG/ML IV BOLUS
INTRAVENOUS | Status: AC
  Filled 2018-08-04: qty 20

## 2018-08-04 MED ORDER — THROMBIN 5000 UNITS EX SOLR
CUTANEOUS | Status: DC | PRN
  Administered 2018-08-04: 5000 [IU] via TOPICAL

## 2018-08-04 MED ORDER — METHYLPREDNISOLONE ACETATE 40 MG/ML IJ SUSP
INTRAMUSCULAR | Status: DC | PRN
  Administered 2018-08-04: 40 mg

## 2018-08-04 MED ORDER — OXYCODONE HCL 5 MG PO TABS: 5.0000 mg | ORAL_TABLET | ORAL | 0 refills | Status: DC | PRN

## 2018-08-04 MED ORDER — LACTATED RINGERS IV SOLN
INTRAVENOUS | Status: DC
  Administered 2018-08-04: 08:00:00 via INTRAVENOUS

## 2018-08-04 MED ORDER — ONDANSETRON HCL 4 MG/2ML IJ SOLN
INTRAMUSCULAR | Status: DC | PRN
  Administered 2018-08-04: 4 mg via INTRAVENOUS

## 2018-08-04 MED ORDER — MIDAZOLAM HCL 2 MG/2ML IJ SOLN
INTRAMUSCULAR | Status: AC
  Filled 2018-08-04: qty 2

## 2018-08-04 MED ORDER — KETAMINE HCL 50 MG/ML IJ SOLN
INTRAMUSCULAR | Status: AC
  Filled 2018-08-04: qty 10

## 2018-08-04 MED ORDER — BUPIVACAINE HCL 0.5 % IJ SOLN
INTRAMUSCULAR | Status: DC | PRN
  Administered 2018-08-04: 20 mL

## 2018-08-04 MED ORDER — THROMBIN 5000 UNITS EX SOLR
CUTANEOUS | Status: AC
  Filled 2018-08-04: qty 5000

## 2018-08-04 MED ORDER — FAMOTIDINE 20 MG PO TABS
ORAL_TABLET | ORAL | Status: AC
  Filled 2018-08-04: qty 1

## 2018-08-04 MED ORDER — BUPIVACAINE HCL (PF) 0.5 % IJ SOLN
INTRAMUSCULAR | Status: AC
  Filled 2018-08-04: qty 30

## 2018-08-04 MED ORDER — BUPIVACAINE-EPINEPHRINE (PF) 0.5% -1:200000 IJ SOLN
INTRAMUSCULAR | Status: AC
  Filled 2018-08-04: qty 30

## 2018-08-04 MED ORDER — SODIUM CHLORIDE 0.9 % IJ SOLN
INTRAMUSCULAR | Status: AC
  Filled 2018-08-04: qty 10

## 2018-08-04 MED ORDER — PHENYLEPHRINE HCL 10 MG/ML IJ SOLN
INTRAMUSCULAR | Status: AC
  Filled 2018-08-04: qty 1

## 2018-08-04 MED ORDER — SCOPOLAMINE 1 MG/3DAYS TD PT72
1.0000 | MEDICATED_PATCH | TRANSDERMAL | Status: DC
  Administered 2018-08-04: 1.5 mg via TRANSDERMAL

## 2018-08-04 MED ORDER — SODIUM CHLORIDE 0.9 % IV SOLN
INTRAVENOUS | Status: DC | PRN
  Administered 2018-08-04: 20 ug/min via INTRAVENOUS

## 2018-08-04 MED ORDER — BACITRACIN 50000 UNITS IM SOLR
INTRAMUSCULAR | Status: AC
  Filled 2018-08-04: qty 1

## 2018-08-04 MED ORDER — CEFAZOLIN SODIUM-DEXTROSE 2-4 GM/100ML-% IV SOLN
2.0000 g | INTRAVENOUS | Status: AC
  Administered 2018-08-04: 2 g via INTRAVENOUS

## 2018-08-04 MED ORDER — SODIUM CHLORIDE 0.9 % IV SOLN
INTRAVENOUS | Status: DC | PRN
  Administered 2018-08-04: 40 mL

## 2018-08-04 MED ORDER — SCOPOLAMINE 1 MG/3DAYS TD PT72
MEDICATED_PATCH | TRANSDERMAL | Status: AC
  Filled 2018-08-04: qty 1

## 2018-08-04 MED ORDER — SODIUM CHLORIDE FLUSH 0.9 % IV SOLN
INTRAVENOUS | Status: AC
  Administered 2018-08-04: 10 mL
  Filled 2018-08-04: qty 10

## 2018-08-04 MED ORDER — MIDAZOLAM HCL 2 MG/2ML IJ SOLN
INTRAMUSCULAR | Status: DC | PRN
  Administered 2018-08-04: 2 mg via INTRAVENOUS

## 2018-08-04 MED ORDER — SODIUM CHLORIDE FLUSH 0.9 % IV SOLN
INTRAVENOUS | Status: AC
  Filled 2018-08-04: qty 10

## 2018-08-04 MED ORDER — EPHEDRINE SULFATE 50 MG/ML IJ SOLN
INTRAMUSCULAR | Status: AC
  Filled 2018-08-04: qty 1

## 2018-08-04 MED ORDER — FAMOTIDINE 20 MG PO TABS
20.0000 mg | ORAL_TABLET | Freq: Once | ORAL | Status: AC
  Administered 2018-08-04: 20 mg via ORAL

## 2018-08-04 MED ORDER — GLYCOPYRROLATE 0.2 MG/ML IJ SOLN
INTRAMUSCULAR | Status: AC
  Filled 2018-08-04: qty 1

## 2018-08-04 MED ORDER — GLYCOPYRROLATE 0.2 MG/ML IJ SOLN
INTRAMUSCULAR | Status: DC | PRN
  Administered 2018-08-04: 0.2 mg via INTRAVENOUS

## 2018-08-04 MED ORDER — ROCURONIUM BROMIDE 100 MG/10ML IV SOLN
INTRAVENOUS | Status: DC | PRN
  Administered 2018-08-04: 5 mg via INTRAVENOUS

## 2018-08-04 MED ORDER — DEXAMETHASONE SODIUM PHOSPHATE 10 MG/ML IJ SOLN
INTRAMUSCULAR | Status: AC
  Filled 2018-08-04: qty 1

## 2018-08-04 MED ORDER — SUCCINYLCHOLINE CHLORIDE 20 MG/ML IJ SOLN
INTRAMUSCULAR | Status: DC | PRN
  Administered 2018-08-04: 120 mg via INTRAVENOUS

## 2018-08-04 SURGICAL SUPPLY — 58 items
BUR NEURO DRILL SOFT 3.0X3.8M (BURR) ×3 IMPLANT
CANISTER SUCT 1200ML W/VALVE (MISCELLANEOUS) ×6 IMPLANT
CHLORAPREP W/TINT 26ML (MISCELLANEOUS) ×6 IMPLANT
CNTNR SPEC 2.5X3XGRAD LEK (MISCELLANEOUS) ×1
CONT SPEC 4OZ STER OR WHT (MISCELLANEOUS) ×2
CONTAINER SPEC 2.5X3XGRAD LEK (MISCELLANEOUS) ×1 IMPLANT
COUNTER NEEDLE 20/40 LG (NEEDLE) ×3 IMPLANT
COVER LIGHT HANDLE STERIS (MISCELLANEOUS) ×6 IMPLANT
COVER WAND RF STERILE (DRAPES) ×3 IMPLANT
CUP MEDICINE 2OZ PLAST GRAD ST (MISCELLANEOUS) ×6 IMPLANT
DERMABOND ADVANCED (GAUZE/BANDAGES/DRESSINGS) ×2
DERMABOND ADVANCED .7 DNX12 (GAUZE/BANDAGES/DRESSINGS) ×1 IMPLANT
DRAPE C-ARM 42X72 X-RAY (DRAPES) ×6 IMPLANT
DRAPE LAPAROTOMY 100X77 ABD (DRAPES) ×3 IMPLANT
DRAPE MICROSCOPE SPINE 48X150 (DRAPES) ×3 IMPLANT
DRAPE POUCH INSTRU U-SHP 10X18 (DRAPES) ×3 IMPLANT
DRAPE SURG 17X11 SM STRL (DRAPES) ×12 IMPLANT
ELECT CAUTERY BLADE TIP 2.5 (TIP) ×3
ELECT EZSTD 165MM 6.5IN (MISCELLANEOUS)
ELECT REM PT RETURN 9FT ADLT (ELECTROSURGICAL) ×3
ELECTRODE CAUTERY BLDE TIP 2.5 (TIP) ×1 IMPLANT
ELECTRODE EZSTD 165MM 6.5IN (MISCELLANEOUS) IMPLANT
ELECTRODE REM PT RTRN 9FT ADLT (ELECTROSURGICAL) ×1 IMPLANT
FRAME EYE SHIELD (PROTECTIVE WEAR) ×6 IMPLANT
GLOVE BIO SURGEON STRL SZ 6.5 (GLOVE) ×4 IMPLANT
GLOVE BIO SURGEONS STRL SZ 6.5 (GLOVE) ×2
GLOVE BIOGEL PI IND STRL 7.0 (GLOVE) ×1 IMPLANT
GLOVE BIOGEL PI INDICATOR 7.0 (GLOVE) ×2
GLOVE SURG SYN 8.5  E (GLOVE) ×6
GLOVE SURG SYN 8.5 E (GLOVE) ×3 IMPLANT
GOWN SRG XL LVL 3 NONREINFORCE (GOWNS) ×1 IMPLANT
GOWN STRL NON-REIN TWL XL LVL3 (GOWNS) ×2
GOWN STRL REUS W/TWL MED LVL3 (GOWN DISPOSABLE) ×3 IMPLANT
GRADUATE 1200CC STRL 31836 (MISCELLANEOUS) ×3 IMPLANT
KIT SPINAL PRONEVIEW (KITS) ×3 IMPLANT
KNIFE BAYONET SHORT DISCETOMY (MISCELLANEOUS) ×3 IMPLANT
MARKER SKIN DUAL TIP RULER LAB (MISCELLANEOUS) ×3 IMPLANT
NDL SAFETY ECLIPSE 18X1.5 (NEEDLE) ×1 IMPLANT
NEEDLE HYPO 18GX1.5 SHARP (NEEDLE) ×2
NEEDLE HYPO 22GX1.5 SAFETY (NEEDLE) ×3 IMPLANT
NS IRRIG 1000ML POUR BTL (IV SOLUTION) ×3 IMPLANT
PACK LAMINECTOMY NEURO (CUSTOM PROCEDURE TRAY) ×3 IMPLANT
PAD ARMBOARD 7.5X6 YLW CONV (MISCELLANEOUS) ×3 IMPLANT
SPOGE SURGIFLO 8M (HEMOSTASIS) ×2
SPONGE SURGIFLO 8M (HEMOSTASIS) ×1 IMPLANT
SUT DVC VLOC 3-0 CL 6 P-12 (SUTURE) ×3 IMPLANT
SUT VIC AB 0 CT1 27 (SUTURE) ×2
SUT VIC AB 0 CT1 27XCR 8 STRN (SUTURE) ×1 IMPLANT
SUT VIC AB 2-0 CT1 18 (SUTURE) ×3 IMPLANT
SYR 10ML LL (SYRINGE) ×3 IMPLANT
SYR 20CC LL (SYRINGE) ×3 IMPLANT
SYR 30ML LL (SYRINGE) ×6 IMPLANT
SYR 3ML LL SCALE MARK (SYRINGE) ×3 IMPLANT
TOWEL OR 17X26 4PK STRL BLUE (TOWEL DISPOSABLE) ×9 IMPLANT
TUBE MATRX SPINL 18MM 7CM DISP (INSTRUMENTS) ×2
TUBE METRX SPINAL 18X7 DISP (INSTRUMENTS) ×1 IMPLANT
TUBING CONNECTING 10 (TUBING) ×2 IMPLANT
TUBING CONNECTING 10' (TUBING) ×1

## 2018-08-04 NOTE — Op Note (Signed)
Indications: Mrs. Defino is a 52 yo female who presented with lumbar radiculopathy and failed conservative management.  She previously had surgery at this level 18 months ago and has a recurrent disc herniation.  Findings: disc herniation on the left at L4-5  Preoperative Diagnosis: Lumbar radiculopathy due to recurrent disc herniation Postoperative Diagnosis: same   EBL: 50 ml IVF: 600 ml Drains: none Disposition: Extubated and Stable to PACU Complications: none  No foley catheter was placed.   Preoperative Note:   Risks of surgery discussed include: infection, bleeding, stroke, coma, death, paralysis, CSF leak, nerve/spinal cord injury, numbness, tingling, weakness, complex regional pain syndrome, recurrent stenosis and/or disc herniation, vascular injury, development of instability, neck/back pain, need for further surgery, persistent symptoms, development of deformity, and the risks of anesthesia. The patient understood these risks and agreed to proceed.  Operative Note:    The patient was then brought from the preoperative center with intravenous access established.  The patient underwent general anesthesia and endotracheal tube intubation, and was then rotated on the Kirvin rail top where all pressure points were appropriately padded.  The skin was then thoroughly cleansed.  Perioperative antibiotic prophylaxis was administered.  Sterile prep and drapes were then applied and a timeout was then observed.  C-arm was brought into the field under sterile conditions, and the L4-5 disc space identified and marked with an incision on the left 1cm lateral to midline.    Once this was complete a 2 cm incision was opened with the use of a #10 blade knife.  The Metrx tubes were sequentially advanced under lateral fluoroscopy until a 18 x 60 mm Metrx tube was placed over the facet and lamina and secured to the bed.    The microscope was then sterilely brought into the field and muscle creep  was hemostased with a bipolar and resected with a pituitary rongeur.  A Bovie extender was then used to expose the spinous process and lamina.  Careful attention was placed to not violate the facet capsule. A 3 mm matchstick drill bit was then used to make a hemi-laminotomy trough until the ligamentum flavum was exposed.  This was extended to the base of the spinous process.  Once this was complete and the underlying ligamentum flavum was visualized this was dissected with an up angle curette and resected with a #2 and #3 mm biting Kerrison.  The laminotomy opening was also expanded in similar fashion and hemostasis was obtained with Surgifoam and a patty as well as bone wax.  The rostral aspect of the caudal level of the lamina was also resected with a #2 biting Kerrison effort to further enhance exposure.  Once the underlying dura was visualized a Penfield 4 was then used to dissect and expose the traversing nerve root.  Once this was identified a nerve root retractor suction was used to mobilize this medially.  The venous plexus was hemostased with Surgifoam and light bipolar use.  A long scalpel was then used to make a small annulotomy within the disc space and disc space contents were noted to come through the annulus.    The disc herniation was identified and dissected free using a balltip probe. The pituitary rongeur was used to remove the extruded disc fragments. We encountered the scarring from the prior surgery, and carefully dissected the recurrent disc herniation medially to resect as much disc herniation material as possible. Once the thecal sac and nerve root were noted to be relaxed and under less tension the  ball-tipped feeler was passed along the foramen distally to to ensure no residual compression was noted.    A Depo-Medrol soaked Gelfoam pledget was placed along the nerve root for 2 minutes and removed.  The area was irrigated. The tube system was then removed under microscopic visualization  and hemostasis was obtained with a bipolar.    The fascial layer was reapproximated with the use of a 0- Vicryl suture.  Subcutaneous tissue layer was reapproximated using 2-0 Vicryl suture.  3-0 monocryl was used on the skin. The skin was then cleansed and Dermabond was used to close the skin opening.  Patient was then rotated back to the preoperative bed awakened from anesthesia and taken to recovery all counts are correct in this case.   I performed the entire procedure with the assistance of Ivar Drape PA as an Designer, television/film set.  Venetia Night MD

## 2018-08-04 NOTE — Anesthesia Preprocedure Evaluation (Signed)
Anesthesia Evaluation  Patient identified by MRN, date of birth, ID band Patient awake    Reviewed: Allergy & Precautions, H&P , NPO status , Patient's Chart, lab work & pertinent test results  History of Anesthesia Complications (+) PONV and history of anesthetic complications  Airway Mallampati: II  TM Distance: >3 FB Neck ROM: full    Dental  (+) Chipped, Caps   Pulmonary neg pulmonary ROS, neg shortness of breath,    Pulmonary exam normal breath sounds clear to auscultation       Cardiovascular Exercise Tolerance: Good (-) angina(-) Past MI and (-) DOE Normal cardiovascular exam+ Valvular Problems/Murmurs  Rhythm:regular Rate:Normal     Neuro/Psych  Headaches, PSYCHIATRIC DISORDERS Anxiety    GI/Hepatic Neg liver ROS, GERD  Controlled,  Endo/Other  negative endocrine ROS  Renal/GU      Musculoskeletal   Abdominal   Peds  Hematology negative hematology ROS (+)   Anesthesia Other Findings Signs and symptoms suggestive of sleep apnea   Past Medical History: No date: Anxiety No date: Back pain No date: Clostridium difficile diarrhea No date: Colon polyp No date: Diverticulosis No date: GERD (gastroesophageal reflux disease) No date: Headache No date: IBS (irritable bowel syndrome) No date: Lumbar herniated disc No date: Tremor  Past Surgical History: No date: COLONOSCOPY No date: DILATION AND CURETTAGE OF UTERUS No date: ESOPHAGOGASTRODUODENOSCOPY ENDOSCOPY No date: POLYPECTOMY No date: uterine cyst  BMI    Body Mass Index:  31.83 kg/m      Reproductive/Obstetrics negative OB ROS                             Anesthesia Physical  Anesthesia Plan  ASA: II  Anesthesia Plan: General ETT   Post-op Pain Management:    Induction: Intravenous  PONV Risk Score and Plan:   Airway Management Planned: Oral ETT  Additional Equipment:   Intra-op Plan:   Post-operative  Plan: Extubation in OR  Informed Consent: I have reviewed the patients History and Physical, chart, labs and discussed the procedure including the risks, benefits and alternatives for the proposed anesthesia with the patient or authorized representative who has indicated his/her understanding and acceptance.   Dental Advisory Given  Plan Discussed with: Anesthesiologist, CRNA and Surgeon  Anesthesia Plan Comments: (Patient consented for risks of anesthesia including but not limited to:  - adverse reactions to medications - damage to teeth, lips or other oral mucosa - sore throat or hoarseness - Damage to heart, brain, lungs or loss of life  Patient voiced understanding.)        Anesthesia Quick Evaluation

## 2018-08-04 NOTE — Discharge Instructions (Addendum)
°Your surgeon has performed an operation on your lumbar spine (low back) to relieve pressure on one or more nerves. Many times, patients feel better immediately after surgery and can “overdo it.” Even if you feel well, it is important that you follow these activity guidelines. If you do not let your back heal properly from the surgery, you can increase the chance of a disc herniation and/or return of your symptoms. The following are instructions to help in your recovery once you have been discharged from the hospital. ° °* Do not take anti-inflammatory medications for 3 days after surgery (naproxen [Aleve], ibuprofen [Advil, Motrin], celecoxib [Celebrex], etc.) ° °Activity  °  °No bending, lifting, or twisting (“BLT”). Avoid lifting objects heavier than 10 pounds (gallon milk jug).  Where possible, avoid household activities that involve lifting, bending, pushing, or pulling such as laundry, vacuuming, grocery shopping, and childcare. Try to arrange for help from friends and family for these activities while your back heals. ° °Increase physical activity slowly as tolerated.  Taking short walks is encouraged, but avoid strenuous exercise. Do not jog, run, bicycle, lift weights, or participate in any other exercises unless specifically allowed by your doctor. Avoid prolonged sitting, including car rides. ° °Talk to your doctor before resuming sexual activity. ° °You should not drive until cleared by your doctor. ° °Until released by your doctor, you should not return to work or school.  You should rest at home and let your body heal.  ° °You may shower two days after your surgery.  After showering, lightly dab your incision dry. Do not take a tub bath or go swimming for 3 weeks, or until approved by your doctor at your follow-up appointment. ° °If you smoke, we strongly recommend that you quit.  Smoking has been proven to interfere with normal healing in your back and will dramatically reduce the success rate of  your surgery. Please contact [facility] (800-QUIT-NOW) and use the resources at www.[facility].com for assistance in stopping smoking. ° °Surgical Incision °  °If you have a dressing on your incision, you may remove it three days after your surgery. Keep your incision area clean and dry. ° °If you have staples or stitches on your incision, you should have a follow up scheduled for removal. If you do not have staples or stitches, you will have steri-strips (small pieces of surgical tape) or Dermabond glue. The steri-strips/glue should begin to peel away within about a week (it is fine if the steri-strips fall off before then). If the strips are still in place one week after your surgery, you may gently remove them. ° °Diet          ° ° You may return to your usual diet. Be sure to stay hydrated. ° °When to Contact Us ° °Although your surgery and recovery will likely be uneventful, you may have some residual numbness, aches, and pains in your back and/or legs. This is normal and should improve in the next few weeks. ° °However, should you experience any of the following, contact us immediately: °• New numbness or weakness °• Pain that is progressively getting worse, and is not relieved by your pain medications or rest °• Bleeding, redness, swelling, pain, or drainage from surgical incision °• Chills or flu-like symptoms °• Fever greater than 101.0 F (38.3 C) °• Problems with bowel or bladder functions °• Difficulty breathing or shortness of breath °• Warmth, tenderness, or swelling in your calf ° °Contact Information °• During office hours (Monday-Friday   9 am to 5 pm), please call your physician at [phone] °• After hours and weekends, please call the [facility] Operator at [phone] and ask for the Neurosurgery Resident On Call  °• For a life-threatening emergency, call 911 ° °AMBULATORY SURGERY  °DISCHARGE INSTRUCTIONS ° ° °1) The drugs that you were given will stay in your system until tomorrow so for the next 24  hours you should not: ° °A) Drive an automobile °B) Make any legal decisions °C) Drink any alcoholic beverage ° ° °2) You may resume regular meals tomorrow.  Today it is better to start with liquids and gradually work up to solid foods. ° °You may eat anything you prefer, but it is better to start with liquids, then soup and crackers, and gradually work up to solid foods. ° ° °3) Please notify your doctor immediately if you have any unusual bleeding, trouble breathing, redness and pain at the surgery site, drainage, fever, or pain not relieved by medication. ° ° ° °4) Additional Instructions: ° °Please contact your physician with any problems or Same Day Surgery at [phone], Monday through Friday 6 am to 4 pm, or [facility] number at [phone]. ° °

## 2018-08-04 NOTE — Anesthesia Procedure Notes (Signed)
Procedure Name: Intubation Date/Time: 08/04/2018 8:38 AM Performed by: Ancil Boozer, RN Pre-anesthesia Checklist: Patient identified, Patient being monitored, Timeout performed, Emergency Drugs available and Suction available Patient Re-evaluated:Patient Re-evaluated prior to induction Oxygen Delivery Method: Circle system utilized Preoxygenation: Pre-oxygenation with 100% oxygen Induction Type: IV induction Ventilation: Mask ventilation without difficulty Laryngoscope Size: Miller and 2 Grade View: Grade I Tube type: Oral Tube size: 7.0 mm Number of attempts: 1 Airway Equipment and Method: Stylet Placement Confirmation: ETT inserted through vocal cords under direct vision,  positive ETCO2 and breath sounds checked- equal and bilateral Secured at: 21 cm Tube secured with: Tape Dental Injury: Teeth and Oropharynx as per pre-operative assessment

## 2018-08-04 NOTE — Anesthesia Postprocedure Evaluation (Signed)
Anesthesia Post Note  Patient: Jordan Peters  Procedure(s) Performed: LUMBAR LAMINECTOMY/DECOMPRESSION MICRODISCECTOMY 1 LEVEL-L4-5 (Left Back)  Patient location during evaluation: PACU Anesthesia Type: General Level of consciousness: awake and alert and oriented Pain management: pain level controlled Vital Signs Assessment: post-procedure vital signs reviewed and stable Respiratory status: spontaneous breathing Cardiovascular status: blood pressure returned to baseline Anesthetic complications: no     Last Vitals:  Vitals:   08/04/18 1136 08/04/18 1209  BP: 124/82 125/71  Pulse: 98 92  Resp: 18 19  Temp: 36.6 C 36.6 C  SpO2: 98% 99%    Last Pain:  Vitals:   08/04/18 1209  TempSrc: Temporal  PainSc: 0-No pain                 Arabelle Bollig

## 2018-08-04 NOTE — Transfer of Care (Signed)
Immediate Anesthesia Transfer of Care Note  Patient: Jordan Peters  Procedure(s) Performed: LUMBAR LAMINECTOMY/DECOMPRESSION MICRODISCECTOMY 1 LEVEL-L4-5 (Left Back)  Patient Location: PACU  Anesthesia Type:General  Level of Consciousness: sedated and responds to stimulation  Airway & Oxygen Therapy: Patient Spontanous Breathing and Patient connected to face mask oxygen  Post-op Assessment: Report given to RN and Post -op Vital signs reviewed and stable  Post vital signs: Reviewed and stable  Last Vitals:  Vitals Value Taken Time  BP 101/72 08/04/2018 10:27 AM  Temp 36.3 C 08/04/2018 10:27 AM  Pulse 81 08/04/2018 10:28 AM  Resp 12 08/04/2018 10:28 AM  SpO2 97 % 08/04/2018 10:28 AM  Vitals shown include unvalidated device data.  Last Pain:  Vitals:   08/04/18 0702  TempSrc: Temporal  PainSc: 1          Complications: No apparent anesthesia complications

## 2018-08-04 NOTE — H&P (Signed)
  History: Jordan Peters is is here for left L4-5 lumbar microdiscectomy for left-sided lumbar radiculopathy. Most recent H&P from Dr. Myer Haff on 07/22/2018 reviewed and symptoms of left lower extremity pain and numbness remain persistent.   PE:  General: awake, alert, and oriented. No apparent distress. Heart: RRR Resp: Clear bilaterally.  Strength: 5/5 throughout lower extremities Sensation: intact and symmetric lower extremities.    Plan: Continue plan for left L4-5 microdiscectomy. Risks previously reviewed with Dr. Myer Haff. Consent completed. Site marked.  All questions and concerns addressed.    Ivar Drape PA-C Department of Neurosurgery

## 2018-08-04 NOTE — Anesthesia Post-op Follow-up Note (Signed)
Anesthesia QCDR form completed.        

## 2018-08-04 NOTE — Discharge Summary (Signed)
Procedure: L4-5 lumbar decompression and microdiscectomy Procedure Date: 08/04/2018 Diagnosis: Lumbar radiculopathy  History: Jordan Peters is here for L4-5 lumbar decompression for lumbar radiculopathy. Tolerated procedure well. Evaluated in post recovery, still disoriented from anesthesia. No complaints of lower extremity pain, numbness, or tingling. No back pain at this time.    Physical Exam: Vitals:   08/04/18 0702  BP: 104/75  Pulse: 79  Resp: 18  Temp: (!) 97.5 F (36.4 C)  SpO2: 99%    AA Ox3 Skin: glue intact. No bleeding.  Strength:5/5 throughout lower extremities Sensation: intact and symmetric lower extremities.  Data:  Recent Labs  Lab 07/28/18 1054  NA 138  K 3.9  CL 106  CO2 23  BUN 12  CREATININE 0.61  GLUCOSE 87  CALCIUM 9.0   No results for input(s): AST, ALT, ALKPHOS in the last 168 hours.  Invalid input(s): TBILI   Recent Labs  Lab 07/28/18 1054  WBC 9.8  HGB 13.9  HCT 39.6  PLT 314   Recent Labs  Lab 07/28/18 1054  APTT 27  INR 0.99         Other tests/results: No imaging reviewed.   Assessment/Plan:  Jordan Peters is POD0 s/p L4/5 lumbar decompression for lumbar radiculopathy. Symptoms prior to surgery seem to be resolved at this time, but patient is still not completely alert at this time. Pain control with oxycodone, robaxin, and tylenol as needed. She will follow up in 2 weeks to monitor progress.   Ivar Drape PA-C Department of Neurosurgery

## 2018-12-20 ENCOUNTER — Other Ambulatory Visit: Payer: Self-pay | Admitting: Pediatrics

## 2018-12-20 DIAGNOSIS — Z1231 Encounter for screening mammogram for malignant neoplasm of breast: Secondary | ICD-10-CM

## 2019-01-24 ENCOUNTER — Ambulatory Visit: Payer: BLUE CROSS/BLUE SHIELD

## 2019-04-13 ENCOUNTER — Ambulatory Visit: Payer: BLUE CROSS/BLUE SHIELD

## 2019-05-31 ENCOUNTER — Other Ambulatory Visit: Payer: Self-pay

## 2019-05-31 ENCOUNTER — Ambulatory Visit
Admission: RE | Admit: 2019-05-31 | Discharge: 2019-05-31 | Disposition: A | Discharge status: Home / Self Care | Payer: BLUE CROSS/BLUE SHIELD | Source: Ambulatory Visit | Attending: Pediatrics | Admitting: Pediatrics

## 2019-05-31 DIAGNOSIS — Z1231 Encounter for screening mammogram for malignant neoplasm of breast: Secondary | ICD-10-CM | POA: Insufficient documentation

## 2020-01-13 ENCOUNTER — Other Ambulatory Visit: Payer: Self-pay | Admitting: Student

## 2020-01-13 DIAGNOSIS — M5442 Lumbago with sciatica, left side: Secondary | ICD-10-CM

## 2020-01-26 ENCOUNTER — Ambulatory Visit
Admission: RE | Admit: 2020-01-26 | Discharge: 2020-01-26 | Disposition: A | Discharge status: Home / Self Care | Payer: BLUE CROSS/BLUE SHIELD | Source: Ambulatory Visit | Attending: Student | Admitting: Student

## 2020-01-26 ENCOUNTER — Other Ambulatory Visit: Payer: Self-pay

## 2020-01-26 DIAGNOSIS — M5442 Lumbago with sciatica, left side: Secondary | ICD-10-CM | POA: Insufficient documentation

## 2020-04-25 ENCOUNTER — Other Ambulatory Visit: Payer: Self-pay | Admitting: Obstetrics and Gynecology

## 2020-04-25 DIAGNOSIS — Z1231 Encounter for screening mammogram for malignant neoplasm of breast: Secondary | ICD-10-CM

## 2020-05-01 ENCOUNTER — Ambulatory Visit
Admission: RE | Admit: 2020-05-01 | Discharge: 2020-05-01 | Disposition: A | Discharge status: Home / Self Care | Payer: BLUE CROSS/BLUE SHIELD | Source: Ambulatory Visit | Attending: Obstetrics and Gynecology | Admitting: Obstetrics and Gynecology

## 2020-05-01 ENCOUNTER — Other Ambulatory Visit: Payer: Self-pay

## 2020-05-01 DIAGNOSIS — Z1231 Encounter for screening mammogram for malignant neoplasm of breast: Secondary | ICD-10-CM | POA: Insufficient documentation

## 2020-05-07 ENCOUNTER — Other Ambulatory Visit: Payer: Self-pay | Admitting: Obstetrics and Gynecology

## 2020-05-07 DIAGNOSIS — R928 Other abnormal and inconclusive findings on diagnostic imaging of breast: Secondary | ICD-10-CM

## 2020-05-07 DIAGNOSIS — N6320 Unspecified lump in the left breast, unspecified quadrant: Secondary | ICD-10-CM

## 2020-05-08 ENCOUNTER — Ambulatory Visit
Admission: RE | Admit: 2020-05-08 | Discharge: 2020-05-08 | Disposition: A | Discharge status: Home / Self Care | Payer: BLUE CROSS/BLUE SHIELD | Source: Ambulatory Visit | Attending: Obstetrics and Gynecology | Admitting: Obstetrics and Gynecology

## 2020-05-08 DIAGNOSIS — R928 Other abnormal and inconclusive findings on diagnostic imaging of breast: Secondary | ICD-10-CM | POA: Insufficient documentation

## 2020-05-08 DIAGNOSIS — N6320 Unspecified lump in the left breast, unspecified quadrant: Secondary | ICD-10-CM

## 2020-05-10 ENCOUNTER — Ambulatory Visit: Payer: BLUE CROSS/BLUE SHIELD | Attending: Neurosurgery | Admitting: Physical Therapy

## 2020-05-10 ENCOUNTER — Other Ambulatory Visit: Payer: Self-pay

## 2020-05-10 ENCOUNTER — Encounter: Payer: Self-pay | Admitting: Physical Therapy

## 2020-05-10 DIAGNOSIS — R293 Abnormal posture: Secondary | ICD-10-CM | POA: Insufficient documentation

## 2020-05-10 DIAGNOSIS — M545 Low back pain: Secondary | ICD-10-CM | POA: Diagnosis present

## 2020-05-10 DIAGNOSIS — G8929 Other chronic pain: Secondary | ICD-10-CM | POA: Diagnosis present

## 2020-05-10 DIAGNOSIS — M6281 Muscle weakness (generalized): Secondary | ICD-10-CM | POA: Insufficient documentation

## 2020-05-10 NOTE — Therapy (Signed)
[facility] [address] [place], [place], [address] Phone: [phone]   Fax:  [phone]  Physical Therapy Evaluation  Patient Details  Name: Jordan Peters MRN: [id number] Date of Birth: February 21, 1966 Referring Provider (PT): Venetia Night   Encounter Date: 05/10/2020   PT End of Session - 05/11/20 0744    Visit Number 1    Number of Visits 5    Date for PT Re-Evaluation 06/07/20    Authorization - Visit Number 1    Authorization - Number of Visits 10    Progress Note Due on Visit --    PT Start Time 1115    PT Stop Time 1220    PT Time Calculation (min) 65 min    Activity Tolerance Patient tolerated treatment well;No increased pain    Behavior During Therapy WFL for tasks assessed/performed           Past Medical History:  Diagnosis Date  . Anxiety   . Back pain   . Clostridium difficile diarrhea   . Colon polyp   . Diverticulosis   . GERD (gastroesophageal reflux disease)   . Headache   . Heart murmur   . IBS (irritable bowel syndrome)   . Lumbar herniated disc   . PONV (postoperative nausea and vomiting) 12/2017  . Tremor     Past Surgical History:  Procedure Laterality Date  . COLONOSCOPY    . DILATION AND CURETTAGE OF UTERUS    . DILITATION & CURRETTAGE/HYSTROSCOPY WITH NOVASURE ABLATION N/A 03/12/2018   Procedure: fractional DILATATION & CURETTAGE/HYSTEROSCOPY WITH NOVASURE ABLATION;  Surgeon: Christeen Douglas, MD;  Location: [facility];  Service: Gynecology;  Laterality: N/A;  . ESOPHAGOGASTRODUODENOSCOPY ENDOSCOPY    . LUMBAR LAMINECTOMY/DECOMPRESSION MICRODISCECTOMY Right 02/25/2017   Procedure: LUMBAR LAMINECTOMY/DECOMPRESSION MICRODISCECTOMY 1 LEVEL;  Surgeon: Venetia Night, MD;  Location: [facility];  Service: Neurosurgery;  Laterality: Right;  . LUMBAR LAMINECTOMY/DECOMPRESSION MICRODISCECTOMY Left 08/04/2018   Procedure: LUMBAR LAMINECTOMY/DECOMPRESSION MICRODISCECTOMY 1 LEVEL-L4-5;  Surgeon:  Venetia Night, MD;  Location: [facility];  Service: Neurosurgery;  Laterality: Left;  . POLYPECTOMY    . uterine cyst      There were no vitals filed for this visit.    OBJECTIVE  Mental Status Patient's fund of knowledge is within normal limits for educational level.  SENSATION: Pt. reports no changes in sensation of bilat LEs   MUSCULOSKELETAL: Tremor: None Bulk: Normal Tone: Normal No visible step-off along spinal column  Gait Gait appears normal, slight increased hip rotation bilaterally.    Palpation TTP B paraspinals at L4-S1 levels.  Strength (out of 5) R/L 4+/4+ Hip flexion 5/5 Hip abduction 5/5 Hip adduction 5/5 Hip extension 5/5 Knee extension 5/5 Knee flexion 5/5 Ankle dorsiflexion 5/5 Ankle plantarflexion *Indicates pain  Pt. able activate TrA and maintain contraction for 30 seconds. Pt unable to maintain TrA activation with supine marches > 10 sec.     AROM (degrees) Hip IR (0-45): R: 35 L: 46 Hip ER (0-45): R: 25 L: 24 Lumbar flex and ext WFL. Pt. expressed worry about pain with flexion. Trunk rotation with overpressure WFL, no increase in pain   Repeated Movements Repeated ext/flex does not change sx   SPECIAL TESTS  Facet Joint: Extension-Rotation: R: neg L:neg   Hip: FABER: R: neg L: neg FADIR: R: neg L: neg Long axis distraction: Pt. reports feeling good, however feels tighter on R hip compared to L.   5xSTS: 12.7s. Pt. reports tightness in B low back.  Objective measurements completed on examination: See above findings.     Pt. instructed on proper log rolling technique for getting in and out of bed. Pt. given core progression handout for HEP.    PT Long Term Goals - 05/11/20 0753      PT LONG TERM GOAL #1   Title Pt will improve FOTO score to 51 to display pt's perceived improvements with ADL's    Baseline 05/10/2020: 39    Time 4    Period Weeks    Status New    Target Date 06/08/20      PT LONG TERM GOAL #2    Title Pt will be able to walk > 2 miles with no back pain/spasms.    Baseline 05/10/2020: Can walk less than a mile due to back pain/spasms that occur    Time 4    Period Weeks    Status New    Target Date 06/08/20      PT LONG TERM GOAL #3   Title Pt will be able to maintain TrA activation for all TrA activation exercises for > 30 sec for each.    Baseline 05/10/2020: Pt unable to maintain TrA activation with marching after ~10 sec.    Time 4    Period Weeks    Status New    Target Date 06/07/20      PT LONG TERM GOAL #4   Title Pt will stand at standing desk with good posture for 10 min on every hour to reduce slumped seated posture in recliner while working from home.    Baseline 05/10/2020: Reports working from home sitting in recliner. Has standing desk bust hasnt used it.    Time 4    Period Weeks    Status New    Target Date 06/07/20      PT LONG TERM GOAL #5   Title Pt will return to be able to perform a cartwheel with no LBP or back spasms as a recreational activity.    Baseline 05/10/2020: Pt wants to return to cartwheels but scared to perform due to LBP.    Time 4    Period Weeks    Status New    Target Date 06/08/20      Additional Long Term Goals   Additional Long Term Goals Yes      PT LONG TERM GOAL #6   Title Pt will improve 5xSTS score to < 7.1 sec to demonstrate improvements in functional BLE strength.    Baseline 05/10/2020: 12.7 sec    Time 4    Period Weeks    Status New    Target Date 06/08/20           Pt is a pleasant 54 y.o. female referred to PT due to LBP R>L that is around L5-S1 bilaterally. Pt reports her LBP at it's worst is a 7-8/10 NPS that is reported as a shock like muscle spasm pain. At it's best she has no pain. Her pain is exacerbated with bending forward, sitting for prolonged periods, standing after ~30 min, and exacerbated with walking. Pt reports pain improves with heat and muscle relaxants. Pt's strength in BLE's is 5/5 throughout  except for hip flexion which is 4+/5 bilaterally with no increase in pain with resistance. No exacerbation or improvements in LBP with repeated motions in flexion and extension. Pt demonstrates good flexibility bilaterally with LE movements. Limited hip ER and IR but no pain. Pt can maintain TrA activation for 30 sec  in supine. Pt unable to maintain TrA activation with marches after ~10 sec. Pt in prone pt is TTP along B incision from previous microdiscectomy (L4-L5) and along B lumbar paraspinals. Pt can benefit from skilled PT treatment to reduce pain levels and improve core strength with ADL's.         Patient will benefit from skilled therapeutic intervention in order to improve the following deficits and impairments:  Improper body mechanics, Pain, Increased muscle spasms, Postural dysfunction, Decreased activity tolerance, Decreased endurance, Decreased strength  Visit Diagnosis: Muscle weakness (generalized)  Chronic bilateral low back pain without sciatica  Abnormal posture     Problem List Patient Active Problem List   Diagnosis Date Noted  . Vitamin D deficiency 12/19/2015   Cammie Mcgee, PT, DPT # [id number], SPT 05/11/2020, 11:55 AM  [facility] [address] [place], [place], [address] Phone: [phone]   Fax:  [phone]  Name: Jordan Peters MRN: [id number] Date of Birth: 21-Jan-1966

## 2020-05-11 ENCOUNTER — Encounter: Payer: Self-pay | Admitting: Physical Therapy

## 2020-05-14 ENCOUNTER — Encounter: Payer: BLUE CROSS/BLUE SHIELD | Admitting: Physical Therapy

## 2020-05-16 ENCOUNTER — Encounter: Payer: Self-pay | Admitting: Physical Therapy

## 2020-05-16 ENCOUNTER — Other Ambulatory Visit: Payer: Self-pay

## 2020-05-16 ENCOUNTER — Ambulatory Visit: Payer: BLUE CROSS/BLUE SHIELD | Admitting: Physical Therapy

## 2020-05-16 DIAGNOSIS — M6281 Muscle weakness (generalized): Secondary | ICD-10-CM

## 2020-05-16 DIAGNOSIS — G8929 Other chronic pain: Secondary | ICD-10-CM

## 2020-05-16 DIAGNOSIS — R293 Abnormal posture: Secondary | ICD-10-CM

## 2020-05-16 NOTE — Therapy (Signed)
[place] [facility] [address] [place], [place], [address] Phone: [phone]   Fax:  [phone]  Physical Therapy Treatment  Patient Details  Name: Jordan Peters MRN: [id number] Date of Birth: 05-18-1966 Referring Provider (PT): Venetia Night   Encounter Date: 05/16/2020   PT End of Session - 05/16/20 0847    Visit Number 2    Number of Visits 5    Date for PT Re-Evaluation 06/07/20    Authorization - Visit Number 2    Authorization - Number of Visits 10    PT Start Time 0821    PT Stop Time 0902    PT Time Calculation (min) 41 min    Activity Tolerance Patient tolerated treatment well;No increased pain    Behavior During Therapy WFL for tasks assessed/performed           Past Medical History:  Diagnosis Date  . Anxiety   . Back pain   . Clostridium difficile diarrhea   . Colon polyp   . Diverticulosis   . GERD (gastroesophageal reflux disease)   . Headache   . Heart murmur   . IBS (irritable bowel syndrome)   . Lumbar herniated disc   . PONV (postoperative nausea and vomiting) 12/2017  . Tremor     Past Surgical History:  Procedure Laterality Date  . COLONOSCOPY    . DILATION AND CURETTAGE OF UTERUS    . DILITATION & CURRETTAGE/HYSTROSCOPY WITH NOVASURE ABLATION N/A 03/12/2018   Procedure: fractional DILATATION & CURETTAGE/HYSTEROSCOPY WITH NOVASURE ABLATION;  Surgeon: Christeen Douglas, MD;  Location: [facility];  Service: Gynecology;  Laterality: N/A;  . ESOPHAGOGASTRODUODENOSCOPY ENDOSCOPY    . LUMBAR LAMINECTOMY/DECOMPRESSION MICRODISCECTOMY Right 02/25/2017   Procedure: LUMBAR LAMINECTOMY/DECOMPRESSION MICRODISCECTOMY 1 LEVEL;  Surgeon: Venetia Night, MD;  Location: [facility];  Service: Neurosurgery;  Laterality: Right;  . LUMBAR LAMINECTOMY/DECOMPRESSION MICRODISCECTOMY Left 08/04/2018   Procedure: LUMBAR LAMINECTOMY/DECOMPRESSION MICRODISCECTOMY 1 LEVEL-L4-5;  Surgeon: Venetia Night, MD;  Location:  [facility];  Service: Neurosurgery;  Laterality: Left;  . POLYPECTOMY    . uterine cyst      There were no vitals filed for this visit.   Subjective Assessment - 05/16/20 0840    Subjective Pt. reports to therapy with heating pad on low back. Pt. reports having a low activity weekend, and currently has 3-4/10 back pain. She is having to take muslce relaxers every other day.    Pertinent History Two daughters: 1 graduated [employer], other is at [employer].    Limitations Sitting;Standing;Lifting;Walking    How long can you sit comfortably? 30 minutes    How long can you stand comfortably? Long enough to perform an errand in the community (i.e. grocery store)    How long can you walk comfortably? 0.5 miles    Patient Stated Goals Walk up to 5 miles a day, not have that shock-like symptoms. Pt. would like to do a cartwheel.    Currently in Pain? Yes    Pain Score 4     Pain Location Back    Pain Orientation Lower            There ex:  Nustep L3 10 mins  Supine TrA activation 10x10s hold  Supine post pelvic tilts: 10x10s hold   Supine TrA activation marches: 10x each leg  Supine TrA bicycle kicks: 10x each leg  Supine bridges: 2x10 reps. Pt experienced difficulty with maintaining post pelvic tilt and TrA activation  Standing TRX STS: 2x10 reps  PT Long Term Goals - 05/11/20 0753      PT LONG TERM GOAL #1   Title Pt will improve FOTO score to 51 to display pt's perceived improvements with ADL's    Baseline 05/10/2020: 39    Time 4    Period Weeks    Status New    Target Date 06/08/20      PT LONG TERM GOAL #2   Title Pt will be able to walk > 2 miles with no back pain/spasms.    Baseline 05/10/2020: Can walk less than a mile due to back pain/spasms that occur    Time 4    Period Weeks    Status New    Target Date 06/08/20      PT LONG TERM GOAL #3   Title Pt will be able to maintain TrA activation for all TrA activation exercises for > 30 sec for each.     Baseline 05/10/2020: Pt unable to maintain TrA activation with marching after ~10 sec.    Time 4    Period Weeks    Status New    Target Date 06/07/20      PT LONG TERM GOAL #4   Title Pt will stand at standing desk with good posture for 10 min on every hour to reduce slumped seated posture in recliner while working from home.    Baseline 05/10/2020: Reports working from home sitting in recliner. Has standing desk bust hasnt used it.    Time 4    Period Weeks    Status New    Target Date 06/07/20      PT LONG TERM GOAL #5   Title Pt will return to be able to perform a cartwheel with no LBP or back spasms as a recreational activity.    Baseline 05/10/2020: Pt wants to return to cartwheels but scared to perform due to LBP.    Time 4    Period Weeks    Status New    Target Date 06/08/20      Additional Long Term Goals   Additional Long Term Goals Yes      PT LONG TERM GOAL #6   Title Pt will improve 5xSTS score to < 7.1 sec to demonstrate improvements in functional BLE strength.    Baseline 05/10/2020: 12.7 sec    Time 4    Period Weeks    Status New    Target Date 06/08/20                 Plan - 05/16/20 0942    Clinical Impression Statement Pt. reports having done her HEP at home. Pt. completed TrA core progression up to bridges, pt. demonstrates difficulty with completing bridges in maintaining core activation and post pelvic tilt. Pt. completed STS with TRX with chair. Pt. unable to maintain core activation after approx 8 reps of STS. Pt. will continue to benefit from skilled PT to reduce back pain.    Personal Factors and Comorbidities Comorbidity 2;Past/Current Experience    Examination-Activity Limitations Bed Mobility;Bend;Carry;Stand;Stairs;Lift;Squat;Reach Overhead;Locomotion Level    Examination-Participation Restrictions Community Activity    Stability/Clinical Decision Making Evolving/Moderate complexity    Clinical Decision Making Moderate    Rehab Potential  Good    PT Frequency 1x / week    PT Duration 4 weeks    PT Treatment/Interventions ADLs/Self Care Home Management;Cryotherapy;Electrical Stimulation;Moist Heat;Gait training;Stair training;Functional mobility training;Therapeutic activities;Therapeutic exercise;Balance training;Neuromuscular re-education;Patient/family education;Manual techniques;Dry needling    PT Next Visit Plan Reassess HEP,  core progression    PT Home Exercise Plan Medbridge account:    Consulted and Agree with Plan of Care Patient           Patient will benefit from skilled therapeutic intervention in order to improve the following deficits and impairments:  Improper body mechanics, Pain, Increased muscle spasms, Postural dysfunction, Decreased activity tolerance, Decreased endurance, Decreased strength  Visit Diagnosis: Muscle weakness (generalized)  Chronic bilateral low back pain without sciatica  Abnormal posture     Problem List Patient Active Problem List   Diagnosis Date Noted  . Vitamin D deficiency 12/19/2015   Cammie Mcgee, PT, DPT # [id number], SPT 05/17/2020, 8:00 AM  [facility] [address] [place], [place], [address] Phone: [phone]   Fax:  [phone]  Name: Jordan Peters MRN: [id number] Date of Birth: Feb 11, 1966

## 2020-05-21 ENCOUNTER — Encounter: Payer: BLUE CROSS/BLUE SHIELD | Admitting: Physical Therapy

## 2020-05-23 ENCOUNTER — Other Ambulatory Visit: Payer: Self-pay

## 2020-05-23 ENCOUNTER — Ambulatory Visit: Payer: BLUE CROSS/BLUE SHIELD

## 2020-05-23 ENCOUNTER — Encounter: Payer: Self-pay | Admitting: Physical Therapy

## 2020-05-23 DIAGNOSIS — M6281 Muscle weakness (generalized): Secondary | ICD-10-CM

## 2020-05-23 DIAGNOSIS — R293 Abnormal posture: Secondary | ICD-10-CM

## 2020-05-23 DIAGNOSIS — G8929 Other chronic pain: Secondary | ICD-10-CM

## 2020-05-23 NOTE — Therapy (Signed)
[facility] [address] [place], [place], [address] Phone: [phone]   Fax:  [phone]  Physical Therapy Treatment  Patient Details  Name: Jordan Peters MRN: [id number] Date of Birth: 08-06-1966 Referring Provider (PT): Venetia Night   Encounter Date: 05/23/2020   PT End of Session - 05/23/20 1202    Visit Number 3    Number of Visits 5    Date for PT Re-Evaluation 06/07/20    Authorization - Visit Number 3    Authorization - Number of Visits 10    PT Start Time 0900    PT Stop Time 0947    PT Time Calculation (min) 47 min    Activity Tolerance Patient tolerated treatment well;No increased pain    Behavior During Therapy WFL for tasks assessed/performed           Past Medical History:  Diagnosis Date  . Anxiety   . Back pain   . Clostridium difficile diarrhea   . Colon polyp   . Diverticulosis   . GERD (gastroesophageal reflux disease)   . Headache   . Heart murmur   . IBS (irritable bowel syndrome)   . Lumbar herniated disc   . PONV (postoperative nausea and vomiting) 12/2017  . Tremor     Past Surgical History:  Procedure Laterality Date  . COLONOSCOPY    . DILATION AND CURETTAGE OF UTERUS    . DILITATION & CURRETTAGE/HYSTROSCOPY WITH NOVASURE ABLATION N/A 03/12/2018   Procedure: fractional DILATATION & CURETTAGE/HYSTEROSCOPY WITH NOVASURE ABLATION;  Surgeon: Christeen Douglas, MD;  Location: [facility];  Service: Gynecology;  Laterality: N/A;  . ESOPHAGOGASTRODUODENOSCOPY ENDOSCOPY    . LUMBAR LAMINECTOMY/DECOMPRESSION MICRODISCECTOMY Right 02/25/2017   Procedure: LUMBAR LAMINECTOMY/DECOMPRESSION MICRODISCECTOMY 1 LEVEL;  Surgeon: Venetia Night, MD;  Location: [facility];  Service: Neurosurgery;  Laterality: Right;  . LUMBAR LAMINECTOMY/DECOMPRESSION MICRODISCECTOMY Left 08/04/2018   Procedure: LUMBAR LAMINECTOMY/DECOMPRESSION MICRODISCECTOMY 1 LEVEL-L4-5;  Surgeon: Venetia Night, MD;  Location:  [facility];  Service: Neurosurgery;  Laterality: Left;  . POLYPECTOMY    . uterine cyst      There were no vitals filed for this visit.   Subjective Assessment - 05/23/20 1024    Subjective Pt states she has felt "uneasy today" in a sense that she has to be cautious of her movements becuase it feels like her low back may "lock up". Pt reports compliance with HEP and is having difficulty with glut bridges but they are getting "easier". No pain reported. Pt has not been taking muscle relaxers.    Pertinent History Two daughters: 1 graduated Stage [employer], other is at [employer].    Limitations Sitting;Standing;Lifting;Walking    How long can you sit comfortably? 30 minutes    How long can you stand comfortably? Long enough to perform an errand in the community (i.e. grocery store)    How long can you walk comfortably? 0.5 miles    Patient Stated Goals Walk up to 5 miles a day, not have that shock-like symptoms. Pt. would like to do a cartwheel.    Currently in Pain? No/denies    Pain Score 0-No pain    Multiple Pain Sites No          There.ex:  Nu-Step L4 for 10 min with use of UE's/LE's  Supine TrA progression with focus on maintaining TrA for 30+ seconds. Use of gait belt under lumbar spine as tactile cue: Heel slides/marches 2x30 sec/each  Supine bridge: 2x8, with verbal cues to  maintain neutral spine to reduce reports of Lumbar symptoms. Improved carryover after cuing    Side-lying clam shells: 1x12 R/L. Tactile cues for maintaining forward hips for proper form/technique for glut med   Hip Hike on step: 1x5 R/L for glut med strength   Standing B hip abduction/ext with 3 lbs ankle weights: 2x8/each direction  Pt educated on proper plank technique so pt can perform at home.   PT Education - 05/23/20 1025    Education provided Yes    Education Details Added clam shells to HEP    Person(s) Educated Patient    Methods Explanation;Demonstration;Tactile cues;Verbal cues;Handout     Comprehension Verbalized understanding;Returned demonstration               PT Long Term Goals - 05/11/20 0753      PT LONG TERM GOAL #1   Title Pt will improve FOTO score to 51 to display pt's perceived improvements with ADL's    Baseline 05/10/2020: 39    Time 4    Period Weeks    Status New    Target Date 06/08/20      PT LONG TERM GOAL #2   Title Pt will be able to walk > 2 miles with no back pain/spasms.    Baseline 05/10/2020: Can walk less than a mile due to back pain/spasms that occur    Time 4    Period Weeks    Status New    Target Date 06/08/20      PT LONG TERM GOAL #3   Title Pt will be able to maintain TrA activation for all TrA activation exercises for > 30 sec for each.    Baseline 05/10/2020: Pt unable to maintain TrA activation with marching after ~10 sec.    Time 4    Period Weeks    Status New    Target Date 06/07/20      PT LONG TERM GOAL #4   Title Pt will stand at standing desk with good posture for 10 min on every hour to reduce slumped seated posture in recliner while working from home.    Baseline 05/10/2020: Reports working from home sitting in recliner. Has standing desk bust hasnt used it.    Time 4    Period Weeks    Status New    Target Date 06/07/20      PT LONG TERM GOAL #5   Title Pt will return to be able to perform a cartwheel with no LBP or back spasms as a recreational activity.    Baseline 05/10/2020: Pt wants to return to cartwheels but scared to perform due to LBP.    Time 4    Period Weeks    Status New    Target Date 06/08/20      Additional Long Term Goals   Additional Long Term Goals Yes      PT LONG TERM GOAL #6   Title Pt will improve 5xSTS score to < 7.1 sec to demonstrate improvements in functional BLE strength.    Baseline 05/10/2020: 12.7 sec    Time 4    Period Weeks    Status New    Target Date 06/08/20                 Plan - 05/23/20 1202    Clinical Impression Statement Pt improving in log-roll  tehcnique getting onto mat table witm improved form. Use of gait belt under lumbar spine as tactile cue to maintain TrA and  deep core activation. Pt able to perform heel slides for 1 min increments with the gait belt not moving. Pt lost core activation with marches maintaining core activation allowing gait belt to move. Pt had decreased strain on Lumbar spine with bridges with decreasing lumbar lordosis with improved carryover after verbal cues to reduce lumbar motion. Pt displayed good form/technique with plank on elbows for ~20 sec with no increases in back pain and able to perform clam shells for glut med strengthening. Pt can continue to benefit from core strengthening and hip stability to reduce LBP and improve functional mobility.    Personal Factors and Comorbidities Comorbidity 2;Past/Current Experience    Examination-Activity Limitations Bed Mobility;Bend;Carry;Stand;Stairs;Lift;Squat;Reach Overhead;Locomotion Level    Examination-Participation Restrictions Community Activity    Stability/Clinical Decision Making Evolving/Moderate complexity    Rehab Potential Good    PT Frequency 1x / week    PT Duration 4 weeks    PT Treatment/Interventions ADLs/Self Care Home Management;Cryotherapy;Electrical Stimulation;Moist Heat;Gait training;Stair training;Functional mobility training;Therapeutic activities;Therapeutic exercise;Balance training;Neuromuscular re-education;Patient/family education;Manual techniques;Dry needling    PT Next Visit Plan Glut med strength    PT Home Exercise Plan Medbridge account:    Consulted and Agree with Plan of Care Patient           Patient will benefit from skilled therapeutic intervention in order to improve the following deficits and impairments:  Improper body mechanics, Pain, Increased muscle spasms, Postural dysfunction, Decreased activity tolerance, Decreased endurance, Decreased strength  Visit Diagnosis: Muscle weakness (generalized)  Chronic bilateral  low back pain without sciatica  Abnormal posture     Problem List Patient Active Problem List   Diagnosis Date Noted  . Vitamin D deficiency 12/19/2015    Ronnie Derby, SPT 05/23/2020, 12:07 PM Max Fickle, PT, DPT Physical Therapist - [place]    [facility] [address] [place], [place], [address] Phone: [phone]   Fax:  [phone]  Name: Jordan Peters MRN: [id number] Date of Birth: 1966-04-18

## 2020-05-28 ENCOUNTER — Encounter: Payer: BLUE CROSS/BLUE SHIELD | Admitting: Physical Therapy

## 2020-05-30 ENCOUNTER — Other Ambulatory Visit: Payer: Self-pay

## 2020-05-30 ENCOUNTER — Encounter: Payer: Self-pay | Admitting: Physical Therapy

## 2020-05-30 ENCOUNTER — Ambulatory Visit: Payer: BLUE CROSS/BLUE SHIELD | Attending: Pediatrics | Admitting: Physical Therapy

## 2020-05-30 DIAGNOSIS — R293 Abnormal posture: Secondary | ICD-10-CM | POA: Insufficient documentation

## 2020-05-30 DIAGNOSIS — G8929 Other chronic pain: Secondary | ICD-10-CM

## 2020-05-30 DIAGNOSIS — M545 Low back pain: Secondary | ICD-10-CM | POA: Diagnosis present

## 2020-05-30 DIAGNOSIS — M6281 Muscle weakness (generalized): Secondary | ICD-10-CM | POA: Diagnosis not present

## 2020-05-30 NOTE — Therapy (Signed)
[place] [address] [place], [place], [address] Phone: [phone]   Fax:  [phone]  Physical Therapy Treatment  Patient Details  Name: Jordan Peters MRN: [id number] Date of Birth: 1966-03-04 Referring Provider (PT): Venetia Night   Encounter Date: 05/30/2020   PT End of Session - 05/30/20 0820    Visit Number 4    Number of Visits 5    Date for PT Re-Evaluation 06/07/20    Authorization - Visit Number 4    Authorization - Number of Visits 10    PT Start Time [phone]    PT Stop Time 0900    PT Time Calculation (min) 48 min    Activity Tolerance Patient tolerated treatment well;No increased pain    Behavior During Therapy WFL for tasks assessed/performed           Past Medical History:  Diagnosis Date  . Anxiety   . Back pain   . Clostridium difficile diarrhea   . Colon polyp   . Diverticulosis   . GERD (gastroesophageal reflux disease)   . Headache   . Heart murmur   . IBS (irritable bowel syndrome)   . Lumbar herniated disc   . PONV (postoperative nausea and vomiting) 12/2017  . Tremor     Past Surgical History:  Procedure Laterality Date  . COLONOSCOPY    . DILATION AND CURETTAGE OF UTERUS    . DILITATION & CURRETTAGE/HYSTROSCOPY WITH NOVASURE ABLATION N/A 03/12/2018   Procedure: fractional DILATATION & CURETTAGE/HYSTEROSCOPY WITH NOVASURE ABLATION;  Surgeon: Christeen Douglas, MD;  Location: [facility];  Service: Gynecology;  Laterality: N/A;  . ESOPHAGOGASTRODUODENOSCOPY ENDOSCOPY    . LUMBAR LAMINECTOMY/DECOMPRESSION MICRODISCECTOMY Right 02/25/2017   Procedure: LUMBAR LAMINECTOMY/DECOMPRESSION MICRODISCECTOMY 1 LEVEL;  Surgeon: Venetia Night, MD;  Location: [facility];  Service: Neurosurgery;  Laterality: Right;  . LUMBAR LAMINECTOMY/DECOMPRESSION MICRODISCECTOMY Left 08/04/2018   Procedure: LUMBAR LAMINECTOMY/DECOMPRESSION MICRODISCECTOMY 1 LEVEL-L4-5;  Surgeon: Venetia Night, MD;  Location:  [facility];  Service: Neurosurgery;  Laterality: Left;  . POLYPECTOMY    . uterine cyst      There were no vitals filed for this visit.   Subjective Assessment - 05/30/20 0812    Subjective Pt. states that she had to start taking muscle relaxer again this past Saturday. She feels that the exercises may be causing some pain due to pushing too hard.    Pertinent History Two daughters: 1 graduated Stage manager, other is at [employer].    Limitations Sitting;Standing;Lifting;Walking    How long can you sit comfortably? 30 minutes    How long can you stand comfortably? Long enough to perform an errand in the community (i.e. grocery store)    How long can you walk comfortably? 0.5 miles    Patient Stated Goals Walk up to 5 miles a day, not have that shock-like symptoms. Pt. would like to do a cartwheel.    Currently in Pain? No/denies           There Ex:  Supine TrA activation: 10x5 sec holds  Supine bridges: pt began with about 1/4-1/2 height bridges in supine with TRA activation. Pt stated these were difficult, so regressed to bridges with red bolster under knees. Pt able to better complete bridges with this set up. Pt cued for slowing down descent, and maintaining core activation at top of bridge  Seated grey physioball activities: pt required cueing for maintaining core activation throughout all  -marches -alternating march and arm raises -weighted ball  toss -weighted ball toss outside BOS -weighted ball toss with press up -weighted ball toss with press up with marches  Manual:  Prone STM to bilateral lumbar paraspinals: PT noted tightness in bilat paraspinals, pt responded well to manual therapy. 15 mins       PT Long Term Goals - 05/11/20 0753      PT LONG TERM GOAL #1   Title Pt will improve FOTO score to 51 to display pt's perceived improvements with ADL's    Baseline 05/10/2020: 39    Time 4    Period Weeks    Status New    Target Date 06/08/20      PT LONG TERM GOAL #2    Title Pt will be able to walk > 2 miles with no back pain/spasms.    Baseline 05/10/2020: Can walk less than a mile due to back pain/spasms that occur    Time 4    Period Weeks    Status New    Target Date 06/08/20      PT LONG TERM GOAL #3   Title Pt will be able to maintain TrA activation for all TrA activation exercises for > 30 sec for each.    Baseline 05/10/2020: Pt unable to maintain TrA activation with marching after ~10 sec.    Time 4    Period Weeks    Status New    Target Date 06/07/20      PT LONG TERM GOAL #4   Title Pt will stand at standing desk with good posture for 10 min on every hour to reduce slumped seated posture in recliner while working from home.    Baseline 05/10/2020: Reports working from home sitting in recliner. Has standing desk bust hasnt used it.    Time 4    Period Weeks    Status New    Target Date 06/07/20      PT LONG TERM GOAL #5   Title Pt will return to be able to perform a cartwheel with no LBP or back spasms as a recreational activity.    Baseline 05/10/2020: Pt wants to return to cartwheels but scared to perform due to LBP.    Time 4    Period Weeks    Status New    Target Date 06/08/20      Additional Long Term Goals   Additional Long Term Goals Yes      PT LONG TERM GOAL #6   Title Pt will improve 5xSTS score to < 7.1 sec to demonstrate improvements in functional BLE strength.    Baseline 05/10/2020: 12.7 sec    Time 4    Period Weeks    Status New    Target Date 06/08/20                 Plan - 05/30/20 1153    Clinical Impression Statement Pt. is improving with TrA activation in supine and sitting positions today. Pt. completed seated on physioball tossing and catching activities to challenge TrA activation in different positions. Pt. educated more on pain science and how to mentally approach pain. Pt. will continue to benefit from skilled PT to improve LBP and improve ability to complete ADLs.    Personal Factors and  Comorbidities Comorbidity 2;Past/Current Experience    Examination-Activity Limitations Bed Mobility;Bend;Carry;Stand;Stairs;Lift;Squat;Reach Overhead;Locomotion Level    Examination-Participation Restrictions Community Activity    Stability/Clinical Decision Making Evolving/Moderate complexity    Clinical Decision Making Moderate    Rehab Potential Good  PT Frequency 1x / week    PT Duration 4 weeks    PT Treatment/Interventions ADLs/Self Care Home Management;Cryotherapy;Electrical Stimulation;Moist Heat;Gait training;Stair training;Functional mobility training;Therapeutic activities;Therapeutic exercise;Balance training;Neuromuscular re-education;Patient/family education;Manual techniques;Dry needling    PT Next Visit Plan Glut med strength    PT Home Exercise Plan Medbridge account:    Consulted and Agree with Plan of Care Patient           Patient will benefit from skilled therapeutic intervention in order to improve the following deficits and impairments:  Improper body mechanics, Pain, Increased muscle spasms, Postural dysfunction, Decreased activity tolerance, Decreased endurance, Decreased strength  Visit Diagnosis: Muscle weakness (generalized)  Chronic bilateral low back pain without sciatica  Abnormal posture     Problem List Patient Active Problem List   Diagnosis Date Noted  . Vitamin D deficiency 12/19/2015   Cammie Mcgee, PT, DPT # [id number] Sharyn Creamer, SPT 05/30/2020, 1:54 PM  [facility] [address] [place], [place], [address] Phone: [phone]   Fax:  [phone]  Name: Jordan Peters MRN: [id number] Date of Birth: [birth year]

## 2020-06-04 ENCOUNTER — Encounter: Payer: BLUE CROSS/BLUE SHIELD | Admitting: Physical Therapy

## 2020-06-06 ENCOUNTER — Ambulatory Visit: Payer: BLUE CROSS/BLUE SHIELD | Admitting: Physical Therapy

## 2020-06-06 ENCOUNTER — Encounter: Payer: Self-pay | Admitting: Physical Therapy

## 2020-06-06 ENCOUNTER — Other Ambulatory Visit: Payer: Self-pay

## 2020-06-06 DIAGNOSIS — R293 Abnormal posture: Secondary | ICD-10-CM

## 2020-06-06 DIAGNOSIS — M6281 Muscle weakness (generalized): Secondary | ICD-10-CM

## 2020-06-06 DIAGNOSIS — M545 Low back pain, unspecified: Secondary | ICD-10-CM

## 2020-06-06 NOTE — Therapy (Signed)
[facility] [address] [place], [place], [address] Phone: [phone]   Fax:  [phone]  Physical Therapy Treatment  Patient Details  Name: Tiaunna Buford MRN: [id number] Date of Birth: Feb 02, 1966 Referring Provider (PT): Venetia Night   Encounter Date: 06/06/2020   PT End of Session - 06/06/20 1155    Visit Number 5    Number of Visits 5    Date for PT Re-Evaluation 06/07/20    Authorization - Visit Number 5    Authorization - Number of Visits 10    PT Start Time [phone]    PT Stop Time 0900    PT Time Calculation (min) 49 min    Activity Tolerance Patient tolerated treatment well;No increased pain    Behavior During Therapy WFL for tasks assessed/performed           Past Medical History:  Diagnosis Date  . Anxiety   . Back pain   . Clostridium difficile diarrhea   . Colon polyp   . Diverticulosis   . GERD (gastroesophageal reflux disease)   . Headache   . Heart murmur   . IBS (irritable bowel syndrome)   . Lumbar herniated disc   . PONV (postoperative nausea and vomiting) 12/2017  . Tremor     Past Surgical History:  Procedure Laterality Date  . COLONOSCOPY    . DILATION AND CURETTAGE OF UTERUS    . DILITATION & CURRETTAGE/HYSTROSCOPY WITH NOVASURE ABLATION N/A 03/12/2018   Procedure: fractional DILATATION & CURETTAGE/HYSTEROSCOPY WITH NOVASURE ABLATION;  Surgeon: Christeen Douglas, MD;  Location: [facility];  Service: Gynecology;  Laterality: N/A;  . ESOPHAGOGASTRODUODENOSCOPY ENDOSCOPY    . LUMBAR LAMINECTOMY/DECOMPRESSION MICRODISCECTOMY Right 02/25/2017   Procedure: LUMBAR LAMINECTOMY/DECOMPRESSION MICRODISCECTOMY 1 LEVEL;  Surgeon: Venetia Night, MD;  Location: [facility];  Service: Neurosurgery;  Laterality: Right;  . LUMBAR LAMINECTOMY/DECOMPRESSION MICRODISCECTOMY Left 08/04/2018   Procedure: LUMBAR LAMINECTOMY/DECOMPRESSION MICRODISCECTOMY 1 LEVEL-L4-5;  Surgeon: Venetia Night, MD;  Location:  [facility];  Service: Neurosurgery;  Laterality: Left;  . POLYPECTOMY    . uterine cyst      There were no vitals filed for this visit.   Subjective Assessment - 06/06/20 1152    Subjective Pt reports still having to take muscle relaxers for her low back due to helping daughter move over the weekend. Pt states TrA exercises are becoming easier. Pt is in no pain prior to treatment but reports stiffness.    Pertinent History Two daughters: 1 graduated Stage [employer], other is at [employer].    Limitations Sitting;Standing;Lifting;Walking    How long can you sit comfortably? 30 minutes    How long can you stand comfortably? Long enough to perform an errand in the community (i.e. grocery store)    How long can you walk comfortably? 0.5 miles    Patient Stated Goals Walk up to 5 miles a day, not have that shock-like symptoms. Pt. would like to do a cartwheel.    Currently in Pain? No/denies    Pain Score 0-No pain            There.ex:   Nu-Step L4 for 10 min with use of UE's/LE's. Education on use of ice instead of heat for low back to decrease inflammation.  Supine core and use of gait belt as tactile cue on low back for core activation:   Marches: 2x12   Deadbugs: 2x12  Quadraped core exercises: Use of PVC pipe on sacrum as tactile cue for neutral spine:  Alternating shoulder taps: 2x10  Bird dog: 2x8. Reports of low back tightening with second set and loss of PVC pipe Standing hip exercises: 3 lbs ankle weights   Lateral walking: 2x10. Increased LBP symptoms.   Marching: 2x10   Hip abd: 2x10     PT Education - 06/06/20 1154    Education provided Yes    Education Details Progressing core exercise in quadraped: Alternating shoudler taps and bird dogs    Person(s) Educated Patient    Methods Explanation;Tactile cues;Verbal cues    Comprehension Verbalized understanding;Returned demonstration               PT Long Term Goals - 05/11/20 0753      PT LONG TERM GOAL #1   Title Pt  will improve FOTO score to 51 to display pt's perceived improvements with ADL's    Baseline 05/10/2020: 39    Time 4    Period Weeks    Status New    Target Date 06/08/20      PT LONG TERM GOAL #2   Title Pt will be able to walk > 2 miles with no back pain/spasms.    Baseline 05/10/2020: Can walk less than a mile due to back pain/spasms that occur    Time 4    Period Weeks    Status New    Target Date 06/08/20      PT LONG TERM GOAL #3   Title Pt will be able to maintain TrA activation for all TrA activation exercises for > 30 sec for each.    Baseline 05/10/2020: Pt unable to maintain TrA activation with marching after ~10 sec.    Time 4    Period Weeks    Status New    Target Date 06/07/20      PT LONG TERM GOAL #4   Title Pt will stand at standing desk with good posture for 10 min on every hour to reduce slumped seated posture in recliner while working from home.    Baseline 05/10/2020: Reports working from home sitting in recliner. Has standing desk bust hasnt used it.    Time 4    Period Weeks    Status New    Target Date 06/07/20      PT LONG TERM GOAL #5   Title Pt will return to be able to perform a cartwheel with no LBP or back spasms as a recreational activity.    Baseline 05/10/2020: Pt wants to return to cartwheels but scared to perform due to LBP.    Time 4    Period Weeks    Status New    Target Date 06/08/20      Additional Long Term Goals   Additional Long Term Goals Yes      PT LONG TERM GOAL #6   Title Pt will improve 5xSTS score to < 7.1 sec to demonstrate improvements in functional BLE strength.    Baseline 05/10/2020: 12.7 sec    Time 4    Period Weeks    Status New    Target Date 06/08/20                 Plan - 06/06/20 1155    Clinical Impression Statement Pt displays independence with supine TrA activation exercises with marching and dead bugs with maintaining core activation. Pt progressed to quadraped core exercises of alternating  shoulder taps and bird dogs. Pt lost form on second set of 8 reps and beginning to display reports  of low back involvement. Pt had reports of electric shock feeling in low back with standing hip abd resistance. Pt educated on benefits of ice on low back pain for reducing inflammation. Pt can continue to benefit from skilled PT to improve functional mobility and core strength.    Personal Factors and Comorbidities Comorbidity 2;Past/Current Experience    Examination-Activity Limitations Bed Mobility;Bend;Carry;Stand;Stairs;Lift;Squat;Reach Overhead;Locomotion Level    Examination-Participation Restrictions Community Activity    Stability/Clinical Decision Making Evolving/Moderate complexity    Clinical Decision Making Moderate    Rehab Potential Good    PT Frequency 1x / week    PT Duration 4 weeks    PT Treatment/Interventions ADLs/Self Care Home Management;Cryotherapy;Electrical Stimulation;Moist Heat;Gait training;Stair training;Functional mobility training;Therapeutic activities;Therapeutic exercise;Balance training;Neuromuscular re-education;Patient/family education;Manual techniques;Dry needling    PT Next Visit Plan Discussion of ablation vs injection.  RECERT NEXT TX/ CHECK SCHEDULE.    PT Home Exercise Plan Medbridge account:    Consulted and Agree with Plan of Care Patient           Patient will benefit from skilled therapeutic intervention in order to improve the following deficits and impairments:  Improper body mechanics, Pain, Increased muscle spasms, Postural dysfunction, Decreased activity tolerance, Decreased endurance, Decreased strength  Visit Diagnosis: Muscle weakness (generalized)  Chronic bilateral low back pain without sciatica  Abnormal posture     Problem List Patient Active Problem List   Diagnosis Date Noted  . Vitamin D deficiency 12/19/2015   Cammie Mcgee, PT, DPT # [id number], SPT 06/07/2020, 9:14 AM   [facility] [address] [place], [place], [address] Phone: [phone]   Fax:  [phone]  Name: Loreena Valeri MRN: [id number] Date of Birth: Jul 28, 1966

## 2020-06-11 ENCOUNTER — Encounter: Payer: BLUE CROSS/BLUE SHIELD | Admitting: Physical Therapy

## 2020-06-13 ENCOUNTER — Other Ambulatory Visit: Payer: Self-pay

## 2020-06-13 ENCOUNTER — Ambulatory Visit: Payer: BLUE CROSS/BLUE SHIELD | Admitting: Physical Therapy

## 2020-06-13 ENCOUNTER — Encounter: Payer: Self-pay | Admitting: Physical Therapy

## 2020-06-13 DIAGNOSIS — M6281 Muscle weakness (generalized): Secondary | ICD-10-CM

## 2020-06-13 DIAGNOSIS — G8929 Other chronic pain: Secondary | ICD-10-CM

## 2020-06-13 DIAGNOSIS — R293 Abnormal posture: Secondary | ICD-10-CM

## 2020-06-13 NOTE — Addendum Note (Signed)
Addended by: Cammie Mcgee on: 06/13/2020 01:43 PM   Modules accepted: Orders

## 2020-06-13 NOTE — Therapy (Signed)
[facility] [address] [place], [place], [address] Phone: [phone]   Fax:  [phone]  Physical Therapy Treatment  Patient Details  Name: Jordan Peters MRN: [id number] Date of Birth: 23-Oct-1966 Referring Provider (PT): Meade Maw   Encounter Date: 06/13/2020   PT End of Session - 06/13/20 1035    Visit Number 6    Number of Visits 10    Date for PT Re-Evaluation 07/11/20    Authorization - Visit Number 1    Authorization - Number of Visits 10    PT Start Time 0807    PT Stop Time 0903    PT Time Calculation (min) 56 min    Activity Tolerance Patient tolerated treatment well;No increased pain    Behavior During Therapy WFL for tasks assessed/performed           Past Medical History:  Diagnosis Date   Anxiety    Back pain    Clostridium difficile diarrhea    Colon polyp    Diverticulosis    GERD (gastroesophageal reflux disease)    Headache    Heart murmur    IBS (irritable bowel syndrome)    Lumbar herniated disc    PONV (postoperative nausea and vomiting) 12/2017   Tremor     Past Surgical History:  Procedure Laterality Date   COLONOSCOPY     DILATION AND CURETTAGE OF UTERUS     DILITATION & CURRETTAGE/HYSTROSCOPY WITH NOVASURE ABLATION N/A 03/12/2018   Procedure: fractional DILATATION & CURETTAGE/HYSTEROSCOPY WITH NOVASURE ABLATION;  Surgeon: Benjaman Kindler, MD;  Location: [facility];  Service: Gynecology;  Laterality: N/A;   ESOPHAGOGASTRODUODENOSCOPY ENDOSCOPY     LUMBAR LAMINECTOMY/DECOMPRESSION MICRODISCECTOMY Right 02/25/2017   Procedure: LUMBAR LAMINECTOMY/DECOMPRESSION MICRODISCECTOMY 1 LEVEL;  Surgeon: Meade Maw, MD;  Location: [facility];  Service: Neurosurgery;  Laterality: Right;   LUMBAR LAMINECTOMY/DECOMPRESSION MICRODISCECTOMY Left 08/04/2018   Procedure: LUMBAR LAMINECTOMY/DECOMPRESSION MICRODISCECTOMY 1 LEVEL-L4-5;  Surgeon: Meade Maw, MD;  Location:  [facility];  Service: Neurosurgery;  Laterality: Left;   POLYPECTOMY     uterine cyst      There were no vitals filed for this visit.   Subjective Assessment - 06/13/20 0922    Subjective Pt reports being able to walk 1 mile before experiencing low back becoming "tight". Pt still having to take muscle relaxers for LBP, but only at night instead of mornings and evenings prior. Pt believes she is going to go to epidural injection for LBP versus ablation. Currently experiencing "stiffness".    Pertinent History Two daughters: 1 graduated Sports administrator, other is at [employer].    Limitations Sitting;Standing;Lifting;Walking    How long can you sit comfortably? 30 minutes    How long can you stand comfortably? Long enough to perform an errand in the community (i.e. grocery store)    How long can you walk comfortably? 0.5 miles    Patient Stated Goals Walk up to 5 miles a day, not have that shock-like symptoms. Pt. would like to do a cartwheel.    Currently in Pain? No/denies          Reassessment of pt's goals while pt on NU- Step: See note above on progress with goals.   There.ex:   Nu-Step L4 for 10 min with UE and LE use. Discussion of goals.  Supine marches with TrA activation: 1x20, use of gait belt on lumbar spine as tactile cue for TrA activation. Quadruped Shoulder taps: 2x12. PVC pipe on lumbar spine as tactile  cue for neutral spine Quadruped bird dog: 2x12. PVC pipe on lumbar spine as tactile cue for neutral spine B SLS on blue table (23" tall). 2x10, L easier than R  Seated green exercise ball tosses: 5 min Seated green exercise ball overhead and lateral reaches outside BOS with small medicine ball: 5 min     PT Long Term Goals - 06/13/20 0001      PT LONG TERM GOAL #1   Title Pt will improve FOTO score to 51 to display pt's perceived improvements with ADL's    Baseline 8/18: 41    Time 4    Period Weeks    Status Not Met    Target Date 07/11/20      PT LONG TERM GOAL #2    Title Pt will be able to walk > 2 miles with no back pain/spasms.    Baseline 8/18: Able to walk one mile. After 1 mile feels back "tensing up".    Time 4    Period Weeks    Status Partially Met    Target Date 07/11/20      PT LONG TERM GOAL #3   Title Pt will be able to maintain TrA activation for all TrA activation exercises for > 30 sec for each.    Baseline 8/18: Able to maintain core for 30 sec    Time 0    Period Weeks    Status Achieved    Target Date 06/13/20      PT LONG TERM GOAL #4   Title Pt will stand at standing desk with good posture for 10 min on every hour to reduce slumped seated posture in recliner while working from home.    Baseline 8/18: Still sitting in recliner. Not standing at standing desk.    Time 4    Period Weeks    Status Not Met    Target Date 07/11/20      PT LONG TERM GOAL #5   Title Pt will return to be able to perform a cartwheel with no LBP or back spasms as a recreational activity.    Baseline 05/10/2020: Pt wants to return to cartwheels but scared to perform due to LBP.    Time 4    Period Weeks    Status Not Met    Target Date 07/11/20      PT LONG TERM GOAL #6   Title Pt will improve 5xSTS score to < 7.1 sec to demonstrate improvements in functional BLE strength.    Baseline 8/18: 11.25 sec    Time 4    Period Weeks    Status Partially Met    Target Date 07/11/20              Plan - 06/13/20 1042    Clinical Impression Statement Pt making progress toward her goals with PT, but has not fully met PT goals. Pt improved her FOTO score to 41 with a goal of 51. Pt also improving in her 5xSTS time scoring a 11.25 sec versus a 14.53 sec time at initial eval. Pt is now able to walk a mile before experiencing her low back "tensing up" where pt was unable to walk 1 mile prior to PT. Pt has not been standing up at standing desk while working at home and continues to work from a [employer], occupational. Pt has accomplished ability to activate TrA with all core  exercises today for 30 sec where pt was only able to maintain core activation for 10  seconds. Pt is making progress toward goals with PT but can still benefit from skilled PT to improve functional mobility.    Personal Factors and Comorbidities Comorbidity 2;Past/Current Experience    Examination-Activity Limitations Bed Mobility;Bend;Carry;Stand;Stairs;Lift;Squat;Reach Overhead;Locomotion Level    Examination-Participation Restrictions Community Activity    Stability/Clinical Decision Making Evolving/Moderate complexity    Clinical Decision Making Moderate    Rehab Potential Good    PT Frequency 1x / week    PT Duration 4 weeks    PT Treatment/Interventions ADLs/Self Care Home Management;Cryotherapy;Electrical Stimulation;Moist Heat;Gait training;Stair training;Functional mobility training;Therapeutic activities;Therapeutic exercise;Balance training;Neuromuscular re-education;Patient/family education;Manual techniques;Dry needling    PT Next Visit Plan Glut med strength.    PT Home Exercise Plan Medbridge account:    Consulted and Agree with Plan of Care Patient           Patient will benefit from skilled therapeutic intervention in order to improve the following deficits and impairments:  Improper body mechanics, Pain, Increased muscle spasms, Postural dysfunction, Decreased activity tolerance, Decreased endurance, Decreased strength  Visit Diagnosis: Muscle weakness (generalized)  Chronic bilateral low back pain without sciatica  Abnormal posture     Problem List Patient Active Problem List   Diagnosis Date Noted   Vitamin D deficiency 12/19/2015   Pura Spice, PT, DPT # [id number] Larna Daughters, SPT 06/13/2020, 1:40 PM  [facility] [address] [place], [place], [address] Phone: [phone]   Fax:  [phone]  Name: Jordan Peters MRN: [id number] Date of Birth: 1966/07/29

## 2020-06-18 ENCOUNTER — Encounter: Payer: BLUE CROSS/BLUE SHIELD | Admitting: Physical Therapy

## 2020-06-18 ENCOUNTER — Encounter: Payer: Self-pay | Admitting: Physical Therapy

## 2020-06-27 ENCOUNTER — Other Ambulatory Visit: Payer: Self-pay

## 2020-06-27 ENCOUNTER — Ambulatory Visit: Payer: BLUE CROSS/BLUE SHIELD | Attending: Neurosurgery | Admitting: Physical Therapy

## 2020-06-27 ENCOUNTER — Encounter: Payer: Self-pay | Admitting: Physical Therapy

## 2020-06-27 DIAGNOSIS — M545 Low back pain: Secondary | ICD-10-CM | POA: Insufficient documentation

## 2020-06-27 DIAGNOSIS — M6281 Muscle weakness (generalized): Secondary | ICD-10-CM | POA: Insufficient documentation

## 2020-06-27 DIAGNOSIS — G8929 Other chronic pain: Secondary | ICD-10-CM | POA: Diagnosis present

## 2020-06-27 DIAGNOSIS — R293 Abnormal posture: Secondary | ICD-10-CM | POA: Insufficient documentation

## 2020-06-27 NOTE — Therapy (Signed)
[facility] [address] [place], [place], [address] Phone: [phone]   Fax:  [phone]  Physical Therapy Treatment  Patient Details  Name: Jordan Peters MRN: [id number] Date of Birth: [birth year] Referring Provider (PT): Meade Maw   Encounter Date: 06/27/2020   PT End of Session - 06/27/20 0814    Visit Number 7    Number of Visits 10    Date for PT Re-Evaluation 07/11/20    Authorization - Visit Number 2    Authorization - Number of Visits 10    PT Start Time 3154    PT Stop Time 0086    PT Time Calculation (min) 45 min    Activity Tolerance Patient tolerated treatment well;No increased pain    Behavior During Therapy WFL for tasks assessed/performed           Past Medical History:  Diagnosis Date  . Anxiety   . Back pain   . Clostridium difficile diarrhea   . Colon polyp   . Diverticulosis   . GERD (gastroesophageal reflux disease)   . Headache   . Heart murmur   . IBS (irritable bowel syndrome)   . Lumbar herniated disc   . PONV (postoperative nausea and vomiting) 12/2017  . Tremor     Past Surgical History:  Procedure Laterality Date  . COLONOSCOPY    . DILATION AND CURETTAGE OF UTERUS    . DILITATION & CURRETTAGE/HYSTROSCOPY WITH NOVASURE ABLATION N/A 03/12/2018   Procedure: fractional DILATATION & CURETTAGE/HYSTEROSCOPY WITH NOVASURE ABLATION;  Surgeon: Benjaman Kindler, MD;  Location: [facility];  Service: Gynecology;  Laterality: N/A;  . ESOPHAGOGASTRODUODENOSCOPY ENDOSCOPY    . LUMBAR LAMINECTOMY/DECOMPRESSION MICRODISCECTOMY Right 02/25/2017   Procedure: LUMBAR LAMINECTOMY/DECOMPRESSION MICRODISCECTOMY 1 LEVEL;  Surgeon: Meade Maw, MD;  Location: [facility];  Service: Neurosurgery;  Laterality: Right;  . LUMBAR LAMINECTOMY/DECOMPRESSION MICRODISCECTOMY Left 08/04/2018   Procedure: LUMBAR LAMINECTOMY/DECOMPRESSION MICRODISCECTOMY 1 LEVEL-L4-5;  Surgeon: Meade Maw, MD;  Location:  [facility];  Service: Neurosurgery;  Laterality: Left;  . POLYPECTOMY    . uterine cyst      There were no vitals filed for this visit.   Subjective Assessment - 06/27/20 0810    Subjective Pt. states that she is feeling very stiff today. She "bent the wrong way" yesterday and it has been stiff ever since. Pt. states that she walked a mile yesterday but felt stiff walking to the car. No reported improvements from B epidural injection but pt reports MD told her it can take ~2 weeks to see benefits. With HEP pt states probably not performing enough reps and sets for benefit.    Pertinent History Two daughters: 1 graduated Sports administrator, other is at [employer].    Limitations Sitting;Standing;Lifting;Walking    How long can you sit comfortably? 30 minutes    How long can you stand comfortably? Long enough to perform an errand in the community (i.e. grocery store)    How long can you walk comfortably? 0.5 miles    Patient Stated Goals Walk up to 5 miles a day, not have that shock-like symptoms. Pt. would like to do a cartwheel.    Currently in Pain? No/denies           There Ex:   Nustep L4 10 mins: discussed latest imaging results from lumbar epidural.    Clamshells with RTB: 10x on each side. Verbal and tactile cues for form and technique    Bridge with RTB around knees: 10x  Plank from feet and forearms: 3x30s each side     Side plank from knees: 2x15 s each side    Paloff Press RTB: 3 step outs x5 reps both sides, easier stepping out to the L than R  Discussed updated HEP/POC going forward.    PT Long Term Goals - 06/13/20 0001      PT LONG TERM GOAL #1   Title Pt will improve FOTO score to 51 to display pt's perceived improvements with ADL's    Baseline 8/18: 41    Time 4    Period Weeks    Status Not Met    Target Date 07/11/20      PT LONG TERM GOAL #2   Title Pt will be able to walk > 2 miles with no back pain/spasms.    Baseline 8/18: Able to walk one mile. After 1 mile  feels back "tensing up".    Time 4    Period Weeks    Status Partially Met    Target Date 07/11/20      PT LONG TERM GOAL #3   Title Pt will be able to maintain TrA activation for all TrA activation exercises for > 30 sec for each.    Baseline 8/18: Able to maintain core for 30 sec    Time 0    Period Weeks    Status Achieved    Target Date 06/13/20      PT LONG TERM GOAL #4   Title Pt will stand at standing desk with good posture for 10 min on every hour to reduce slumped seated posture in recliner while working from home.    Baseline 8/18: Still sitting in recliner. Not standing at standing desk.    Time 4    Period Weeks    Status Not Met    Target Date 07/11/20      PT LONG TERM GOAL #5   Title Pt will return to be able to perform a cartwheel with no LBP or back spasms as a recreational activity.    Baseline 05/10/2020: Pt wants to return to cartwheels but scared to perform due to LBP.    Time 4    Period Weeks    Status Not Met    Target Date 07/11/20      PT LONG TERM GOAL #6   Title Pt will improve 5xSTS score to < 7.1 sec to demonstrate improvements in functional BLE strength.    Baseline 8/18: 11.25 sec    Time 4    Period Weeks    Status Partially Met    Target Date 07/11/20                 Plan - 06/27/20 0901    Clinical Impression Statement Due to pt reports of only performing minimal reps and sets of exercise and not seeing benefits, pt educated heavily on importance of performing exercises until muscles are fatigued to see true muscular hypertrophy and benefits. Pt educated on form/technique, reps, and sets for new HEP. Pt demonstrated core weakness with difficulty maintaining good form holding side planks requiring knees on mat as modification. Along with difficulty maintaining standard plank for 30 sec. Pt can continue to benefit from skilled PT to progress core and hip strength to improve pt's functional mobility.    Personal Factors and Comorbidities  Comorbidity 2;Past/Current Experience    Examination-Activity Limitations Bed Mobility;Bend;Carry;Stand;Stairs;Lift;Squat;Reach Overhead;Locomotion Level    Examination-Participation Restrictions Community Activity    Stability/Clinical Decision Making  Evolving/Moderate complexity    Rehab Potential Good    PT Frequency 1x / week    PT Duration 4 weeks    PT Treatment/Interventions ADLs/Self Care Home Management;Cryotherapy;Electrical Stimulation;Moist Heat;Gait training;Stair training;Functional mobility training;Therapeutic activities;Therapeutic exercise;Balance training;Neuromuscular re-education;Patient/family education;Manual techniques;Dry needling    PT Next Visit Plan Glut med strength.    PT Home Exercise Plan Medbridge  Access Code [facility]    Consulted and Agree with Plan of Care Patient           Patient will benefit from skilled therapeutic intervention in order to improve the following deficits and impairments:  Improper body mechanics, Pain, Increased muscle spasms, Postural dysfunction, Decreased activity tolerance, Decreased endurance, Decreased strength  Visit Diagnosis: Muscle weakness (generalized)  Chronic bilateral low back pain without sciatica  Abnormal posture     Problem List Patient Active Problem List   Diagnosis Date Noted  . Vitamin D deficiency 12/19/2015   Pura Spice, PT, DPT # [id number] Carlyle Basques, SPT 06/27/2020, 11:45 AM  [facility] [address] [place], [place], [address] Phone: [phone]   Fax:  [phone]  Name: Jordan Peters MRN: [id number] Date of Birth: 1966-01-12

## 2020-07-04 ENCOUNTER — Ambulatory Visit: Payer: BLUE CROSS/BLUE SHIELD

## 2020-07-04 ENCOUNTER — Other Ambulatory Visit: Payer: Self-pay

## 2020-07-04 ENCOUNTER — Encounter: Payer: Self-pay | Admitting: Physical Therapy

## 2020-07-04 DIAGNOSIS — R293 Abnormal posture: Secondary | ICD-10-CM

## 2020-07-04 DIAGNOSIS — M6281 Muscle weakness (generalized): Secondary | ICD-10-CM | POA: Diagnosis not present

## 2020-07-04 DIAGNOSIS — G8929 Other chronic pain: Secondary | ICD-10-CM

## 2020-07-04 NOTE — Therapy (Signed)
[facility] [address] [place], [place], [address] Phone: [phone]   Fax:  [phone]  Physical Therapy Treatment  Patient Details  Name: Jordan Peters MRN: [id number] Date of Birth: 08-15-1966 Referring Provider (PT): Meade Maw   Encounter Date: 07/04/2020   PT End of Session - 07/04/20 0754    Visit Number 8    Number of Visits 10    Date for PT Re-Evaluation 07/11/20    Authorization - Visit Number 3    Authorization - Number of Visits 10    PT Start Time 6834    PT Stop Time 1962    PT Time Calculation (min) 47 min    Activity Tolerance Patient tolerated treatment well;No increased pain    Behavior During Therapy WFL for tasks assessed/performed           Past Medical History:  Diagnosis Date   Anxiety    Back pain    Clostridium difficile diarrhea    Colon polyp    Diverticulosis    GERD (gastroesophageal reflux disease)    Headache    Heart murmur    IBS (irritable bowel syndrome)    Lumbar herniated disc    PONV (postoperative nausea and vomiting) 12/2017   Tremor     Past Surgical History:  Procedure Laterality Date   COLONOSCOPY     DILATION AND CURETTAGE OF UTERUS     DILITATION & CURRETTAGE/HYSTROSCOPY WITH NOVASURE ABLATION N/A 03/12/2018   Procedure: fractional DILATATION & CURETTAGE/HYSTEROSCOPY WITH NOVASURE ABLATION;  Surgeon: Benjaman Kindler, MD;  Location: [facility];  Service: Gynecology;  Laterality: N/A;   ESOPHAGOGASTRODUODENOSCOPY ENDOSCOPY     LUMBAR LAMINECTOMY/DECOMPRESSION MICRODISCECTOMY Right 02/25/2017   Procedure: LUMBAR LAMINECTOMY/DECOMPRESSION MICRODISCECTOMY 1 LEVEL;  Surgeon: Meade Maw, MD;  Location: [facility];  Service: Neurosurgery;  Laterality: Right;   LUMBAR LAMINECTOMY/DECOMPRESSION MICRODISCECTOMY Left 08/04/2018   Procedure: LUMBAR LAMINECTOMY/DECOMPRESSION MICRODISCECTOMY 1 LEVEL-L4-5;  Surgeon: Meade Maw, MD;  Location:  [facility];  Service: Neurosurgery;  Laterality: Left;   POLYPECTOMY     uterine cyst      There were no vitals filed for this visit.   Subjective Assessment - 07/04/20 0751    Subjective Pt. states feeling stiff in her low back. Pt. is currently not feeling any pain. She states that she went to [employer] graduation recently, and walked about 10,000 steps with no pain.    Pertinent History Two daughters: 1 graduated Sports administrator, other is at [employer].    Limitations Sitting;Standing;Lifting;Walking    How long can you sit comfortably? 30 minutes    How long can you stand comfortably? Long enough to perform an errand in the community (i.e. grocery store)    How long can you walk comfortably? 0.5 miles    Patient Stated Goals Walk up to 5 miles a day, not have that shock-like symptoms. Pt. would like to do a cartwheel.    Currently in Pain? No/denies             There Ex:  Nustep L4 10 mins: discussed history of sx  Paloff press walkouts: 20lbs double arm on nautilus. 6x to the left, 3x to the right. Verbal and tactile cueing for decreased trunk rotation and maintaining square pelvis.  Clamshells against RTB: 2x15 reps each direction. Verbal and tactile cueing to decrease speed and decrease pelvic rotation  Side planks from knees: 3x30s each side. Verbal and tactile cueing to decrease hip height.  Standard planks: 3x30s. Requires  tactile cue on low back to maintain form.          PT Long Term Goals - 06/13/20 0001      PT LONG TERM GOAL #1   Title Pt will improve FOTO score to 51 to display pt's perceived improvements with ADL's    Baseline 8/18: 41    Time 4    Period Weeks    Status Not Met    Target Date 07/11/20      PT LONG TERM GOAL #2   Title Pt will be able to walk > 2 miles with no back pain/spasms.    Baseline 8/18: Able to walk one mile. After 1 mile feels back "tensing up".    Time 4    Period Weeks    Status Partially Met    Target Date 07/11/20      PT  LONG TERM GOAL #3   Title Pt will be able to maintain TrA activation for all TrA activation exercises for > 30 sec for each.    Baseline 8/18: Able to maintain core for 30 sec    Time 0    Period Weeks    Status Achieved    Target Date 06/13/20      PT LONG TERM GOAL #4   Title Pt will stand at standing desk with good posture for 10 min on every hour to reduce slumped seated posture in recliner while working from home.    Baseline 8/18: Still sitting in recliner. Not standing at standing desk.    Time 4    Period Weeks    Status Not Met    Target Date 07/11/20      PT LONG TERM GOAL #5   Title Pt will return to be able to perform a cartwheel with no LBP or back spasms as a recreational activity.    Baseline 05/10/2020: Pt wants to return to cartwheels but scared to perform due to LBP.    Time 4    Period Weeks    Status Not Met    Target Date 07/11/20      PT LONG TERM GOAL #6   Title Pt will improve 5xSTS score to < 7.1 sec to demonstrate improvements in functional BLE strength.    Baseline 8/18: 11.25 sec    Time 4    Period Weeks    Status Partially Met    Target Date 07/11/20                 Plan - 07/04/20 0844    Clinical Impression Statement Pt demonstrating improvements in form/technique with core progression only requiring min verbal cuing with good carryover after cues. Pt demonstrates increased core weakness with paloff walk outs to the R > L only able to perform 3 reps before experiencing reports of Low back "tightening up". Pt demonstrates weakness with forward planks at end of session with inability to maintain 30 sec holds and can only hold for 15 sec with good form where prior sessions could perform for 30 sec. Pt can continue to benefit from skilled PT to improve core strength to reduce lumbar back spasms.    Personal Factors and Comorbidities Comorbidity 2;Past/Current Experience    Examination-Activity Limitations Bed  Mobility;Bend;Carry;Stand;Stairs;Lift;Squat;Reach Overhead;Locomotion Level    Examination-Participation Restrictions Community Activity    Stability/Clinical Decision Making Evolving/Moderate complexity    Rehab Potential Good    PT Frequency 1x / week    PT Duration 4 weeks    PT  Treatment/Interventions ADLs/Self Care Home Management;Cryotherapy;Electrical Stimulation;Moist Heat;Gait training;Stair training;Functional mobility training;Therapeutic activities;Therapeutic exercise;Balance training;Neuromuscular re-education;Patient/family education;Manual techniques;Dry needling    PT Next Visit Plan Progress paloff press and core strength.    PT [place]  Access Code [facility]    Consulted and Agree with Plan of Care Patient           Patient will benefit from skilled therapeutic intervention in order to improve the following deficits and impairments:  Improper body mechanics, Pain, Increased muscle spasms, Postural dysfunction, Decreased activity tolerance, Decreased endurance, Decreased strength  Visit Diagnosis: Muscle weakness (generalized)  Abnormal posture  Chronic bilateral low back pain without sciatica     Problem List Patient Active Problem List   Diagnosis Date Noted   Vitamin D deficiency 12/19/2015    Carlyle Basques, SPT 07/04/2020, 3:12 PM Merdis Delay, PT, DPT Physical Therapist - [facility] [address] [place], [place], [address] Phone: [phone]   Fax:  [phone]  Name: Jordan Peters MRN: [id number] Date of Birth: 23-Jan-1966

## 2020-07-11 ENCOUNTER — Encounter: Payer: Self-pay | Admitting: Physical Therapy

## 2020-07-11 ENCOUNTER — Other Ambulatory Visit: Payer: Self-pay

## 2020-07-11 ENCOUNTER — Ambulatory Visit: Payer: BLUE CROSS/BLUE SHIELD

## 2020-07-11 DIAGNOSIS — M6281 Muscle weakness (generalized): Secondary | ICD-10-CM

## 2020-07-11 DIAGNOSIS — G8929 Other chronic pain: Secondary | ICD-10-CM

## 2020-07-11 DIAGNOSIS — R293 Abnormal posture: Secondary | ICD-10-CM

## 2020-07-11 NOTE — Therapy (Signed)
[facility] [address] [place], [place], [address] Phone: [phone]   Fax:  [phone]  Physical Therapy Treatment Recert Date: 9/[id number]/3810  Patient Details  Name: Jordan Peters MRN: [id number] Date of Birth: 1966-08-12 Referring Provider (PT): Meade Maw   Encounter Date: 07/11/2020   PT End of Session - 07/11/20 0818    Visit Number 9    Number of Visits 18    Date for PT Re-Evaluation 08/08/20    Authorization - Visit Number 4    Authorization - Number of Visits 18    PT Start Time 0815    Activity Tolerance Patient tolerated treatment well;No increased pain    Behavior During Therapy WFL for tasks assessed/performed           Past Medical History:  Diagnosis Date  . Anxiety   . Back pain   . Clostridium difficile diarrhea   . Colon polyp   . Diverticulosis   . GERD (gastroesophageal reflux disease)   . Headache   . Heart murmur   . IBS (irritable bowel syndrome)   . Lumbar herniated disc   . PONV (postoperative nausea and vomiting) 12/2017  . Tremor     Past Surgical History:  Procedure Laterality Date  . COLONOSCOPY    . DILATION AND CURETTAGE OF UTERUS    . DILITATION & CURRETTAGE/HYSTROSCOPY WITH NOVASURE ABLATION N/A 03/12/2018   Procedure: fractional DILATATION & CURETTAGE/HYSTEROSCOPY WITH NOVASURE ABLATION;  Surgeon: Benjaman Kindler, MD;  Location: [facility];  Service: Gynecology;  Laterality: N/A;  . ESOPHAGOGASTRODUODENOSCOPY ENDOSCOPY    . LUMBAR LAMINECTOMY/DECOMPRESSION MICRODISCECTOMY Right 02/25/2017   Procedure: LUMBAR LAMINECTOMY/DECOMPRESSION MICRODISCECTOMY 1 LEVEL;  Surgeon: Meade Maw, MD;  Location: [facility];  Service: Neurosurgery;  Laterality: Right;  . LUMBAR LAMINECTOMY/DECOMPRESSION MICRODISCECTOMY Left 08/04/2018   Procedure: LUMBAR LAMINECTOMY/DECOMPRESSION MICRODISCECTOMY 1 LEVEL-L4-5;  Surgeon: Meade Maw, MD;  Location: [facility];  Service:  Neurosurgery;  Laterality: Left;  . POLYPECTOMY    . uterine cyst      There were no vitals filed for this visit.  Reassessment of goals at beginning of session. See flow sheet.    Nu-Step L5 for 5 min: 5 min. UE/LE's. Discussion of future PT and orthopedic management POC.  5 min spent reviewing and updating progressive HEP.  B clam shells: 2x12, green TB. Tactile cues for form/technique with good carryover after.  B Side-lying hip abduction: 2x8. Verbal cues for form/technique. B Side planks: 2x20 sec  Total gym squats due to c/o Low back "locking up": 2x10. Reports of no LBP compared to sit to stands.  Standing paloff walk outs with green TB: B 1x5 steps and 5 presses   PT Long Term Goals - 06/13/20 0001      PT LONG TERM GOAL #1   Title Pt will improve FOTO score to 51 to display pt's perceived improvements with ADL's    Baseline 8/18: 41    Time 4    Period Weeks    Status Not Met    Target Date 07/11/20      PT LONG TERM GOAL #2   Title Pt will be able to walk > 2 miles with no back pain/spasms.    Baseline 8/18: Able to walk one mile. After 1 mile feels back "tensing up".    Time 4    Period Weeks    Status Partially Met    Target Date 07/11/20      PT LONG TERM GOAL #3  Title Pt will be able to maintain TrA activation for all TrA activation exercises for > 30 sec for each.    Baseline 8/18: Able to maintain core for 30 sec    Time 0    Period Weeks    Status Achieved    Target Date 06/13/20      PT LONG TERM GOAL #4   Title Pt will stand at standing desk with good posture for 10 min on every hour to reduce slumped seated posture in recliner while working from home.    Baseline 8/18: Still sitting in recliner. Not standing at standing desk.    Time 4    Period Weeks    Status Not Met    Target Date 07/11/20      PT LONG TERM GOAL #5   Title Pt will return to be able to perform a cartwheel with no LBP or back spasms as a recreational activity.    Baseline  05/10/2020: Pt wants to return to cartwheels but scared to perform due to LBP.    Time 4    Period Weeks    Status Not Met    Target Date 07/11/20      PT LONG TERM GOAL #6   Title Pt will improve 5xSTS score to < 7.1 sec to demonstrate improvements in functional BLE strength.    Baseline 8/18: 11.25 sec    Time 4    Period Weeks    Status Partially Met    Target Date 07/11/20           Patient will benefit from skilled therapeutic intervention in order to improve the following deficits and impairments:     Visit Diagnosis: Muscle weakness (generalized)  Abnormal posture  Chronic bilateral low back pain without sciatica     Problem List Patient Active Problem List   Diagnosis Date Noted  . Vitamin D deficiency 12/19/2015    Larna Daughters, SPT 07/11/2020, 8:20 AM  [facility] [address] [place], [place], [address] Phone: [phone]   Fax:  [phone]  Name: Jordan Peters MRN: [id number] Date of Birth: Feb 25, 1966

## 2020-07-11 NOTE — Addendum Note (Signed)
Addended by: Fabio Bering on: 07/11/2020 11:43 AM   Modules accepted: Orders

## 2020-07-18 ENCOUNTER — Encounter: Payer: BLUE CROSS/BLUE SHIELD | Admitting: Physical Therapy

## 2020-07-25 ENCOUNTER — Other Ambulatory Visit: Payer: Self-pay

## 2020-07-25 ENCOUNTER — Ambulatory Visit: Payer: BLUE CROSS/BLUE SHIELD | Admitting: Physical Therapy

## 2020-07-25 ENCOUNTER — Encounter: Payer: Self-pay | Admitting: Physical Therapy

## 2020-07-25 DIAGNOSIS — M6281 Muscle weakness (generalized): Secondary | ICD-10-CM | POA: Diagnosis not present

## 2020-07-25 DIAGNOSIS — R293 Abnormal posture: Secondary | ICD-10-CM

## 2020-07-25 DIAGNOSIS — G8929 Other chronic pain: Secondary | ICD-10-CM

## 2020-07-25 NOTE — Therapy (Signed)
[facility] [address] [place], [place], [address] Phone: [phone]   Fax:  [phone]  Physical Therapy Treatment  Patient Details  Name: Geneal Huebert MRN: [id number] Date of Birth: 15-Mar-1966 Referring Provider (PT): Meade Maw   Encounter Date: 07/25/2020   PT End of Session - 07/25/20 0824    Visit Number 10    Number of Visits 18    Date for PT Re-Evaluation 08/08/20    Authorization - Visit Number 2    Authorization - Number of Visits 10    PT Start Time 0730    PT Stop Time 0811    PT Time Calculation (min) 41 min    Activity Tolerance Patient tolerated treatment well;No increased pain    Behavior During Therapy WFL for tasks assessed/performed           Past Medical History:  Diagnosis Date  . Anxiety   . Back pain   . Clostridium difficile diarrhea   . Colon polyp   . Diverticulosis   . GERD (gastroesophageal reflux disease)   . Headache   . Heart murmur   . IBS (irritable bowel syndrome)   . Lumbar herniated disc   . PONV (postoperative nausea and vomiting) 12/2017  . Tremor     Past Surgical History:  Procedure Laterality Date  . COLONOSCOPY    . DILATION AND CURETTAGE OF UTERUS    . DILITATION & CURRETTAGE/HYSTROSCOPY WITH NOVASURE ABLATION N/A 03/12/2018   Procedure: fractional DILATATION & CURETTAGE/HYSTEROSCOPY WITH NOVASURE ABLATION;  Surgeon: Benjaman Kindler, MD;  Location: [facility];  Service: Gynecology;  Laterality: N/A;  . ESOPHAGOGASTRODUODENOSCOPY ENDOSCOPY    . LUMBAR LAMINECTOMY/DECOMPRESSION MICRODISCECTOMY Right 02/25/2017   Procedure: LUMBAR LAMINECTOMY/DECOMPRESSION MICRODISCECTOMY 1 LEVEL;  Surgeon: Meade Maw, MD;  Location: [facility];  Service: Neurosurgery;  Laterality: Right;  . LUMBAR LAMINECTOMY/DECOMPRESSION MICRODISCECTOMY Left 08/04/2018   Procedure: LUMBAR LAMINECTOMY/DECOMPRESSION MICRODISCECTOMY 1 LEVEL-L4-5;  Surgeon: Meade Maw, MD;   Location: [facility];  Service: Neurosurgery;  Laterality: Left;  . POLYPECTOMY    . uterine cyst      There were no vitals filed for this visit.   Subjective Assessment - 07/25/20 0822    Subjective Pt reports stiffness in low back is still occuring however has not had any sharp, shock-like symptoms like she has in the past. Reports no pain currently.    Pertinent History Two daughters: 1 graduated Sports administrator, other is at [employer].    Limitations Sitting;Standing;Lifting;Walking    How long can you sit comfortably? 30 minutes    How long can you stand comfortably? Long enough to perform an errand in the community (i.e. grocery store)    How long can you walk comfortably? 0.5 miles    Patient Stated Goals Walk up to 5 miles a day, not have that shock-like symptoms. Pt. would like to do a cartwheel.    Currently in Pain? No/denies    Pain Score 0-No pain          There.ex:   Side-lying clamshells: Green TB, 1x20   Prone hip IR/ER mobility exercise: 1x20 bilaterally. Verbal and tactile cues for form/technique.  Seated lumbar flexion stretch with exercise ball: 1x12  Seated lumbar flexion stretch with exercise ball with R/L reaching: 1x12   D1/D2 flexion/extension patterns bilaterally at [employer]. 20 lbs with D2 pattern and 10 lbs with D1 pattern. Verbal and tactile cues for form/technique.     PT Education - 07/25/20 2426  Education provided Yes    Education Details Graded standing at work desk. lumbar exercises and rotation exercises. Form/technique.    Person(s) Educated Patient    Methods Explanation;Demonstration;Tactile cues;Verbal cues;Handout    Comprehension Verbalized understanding;Returned demonstration               PT Long Term Goals - 07/11/20 0001      PT LONG TERM GOAL #1   Title Pt will improve FOTO score to 51 to display pt's perceived improvements with ADL's    Baseline 9/15: 39    Time 4    Period Weeks    Status Not Met    Target Date 08/08/20      PT  LONG TERM GOAL #2   Title Pt will be able to walk > 2 miles with no back pain/spasms.    Baseline 9/15: Still able to walk 1 mile. Nerviuse to walk further than 1 mile.    Time 4    Period Weeks    Status Partially Met    Target Date 08/08/20      PT LONG TERM GOAL #4   Title Pt will stand at standing desk with good posture for 10 min on every hour to reduce slumped seated posture in recliner while working from home.    Baseline 9/15: Remains sitting for whole work day. Reports not attempting standing after education provided.    Time 4    Period Weeks    Status Not Met    Target Date 08/08/20      PT LONG TERM GOAL #5   Title Pt will return to be able to perform a cartwheel with no LBP or back spasms as a recreational activity.    Baseline 9/15: unable to perform    Time 4    Period Weeks    Status Not Met    Target Date 08/08/20      PT LONG TERM GOAL #6   Title Pt will improve 5xSTS score to < 7.1 sec to demonstrate improvements in functional BLE strength.    Baseline 9/15: 11.25 sec     Time 4    Period Weeks    Status Partially Met    Target Date 08/08/20                 Plan - 07/25/20 0825    Clinical Impression Statement Due to limited hip IR/ER, and lumbar motion, pt educated on prone hip IR/ER mobility exercises and seated lumbar stretch with exercise ball. Added D1/D2 flexion/extension at Nautilus for core strength and spine mobility with no reports of pain and improvements in lumbar stiffness. Pt had HEP updated and can continue to benefit from skilled PT treatment to improve functional mobility.    Personal Factors and Comorbidities Comorbidity 2;Past/Current Experience    Examination-Activity Limitations Bed Mobility;Bend;Carry;Stand;Stairs;Lift;Squat;Reach Overhead;Locomotion Level    Examination-Participation Restrictions Community Activity    Stability/Clinical Decision Making Evolving/Moderate complexity    Clinical Decision Making Moderate    Rehab  Potential Good    PT Frequency 1x / week    PT Duration 4 weeks    PT Treatment/Interventions ADLs/Self Care Home Management;Cryotherapy;Electrical Stimulation;Moist Heat;Gait training;Stair training;Functional mobility training;Therapeutic activities;Therapeutic exercise;Balance training;Neuromuscular re-education;Patient/family education;Manual techniques;Dry needling    PT Next Visit Plan Lumbar mobility and dynamic core stability.    PT Rogers and Agree with Plan of Care Patient  Patient will benefit from skilled therapeutic intervention in order to improve the following deficits and impairments:  Improper body mechanics, Pain, Increased muscle spasms, Postural dysfunction, Decreased activity tolerance, Decreased endurance, Decreased strength  Visit Diagnosis: Muscle weakness (generalized)  Abnormal posture  Chronic bilateral low back pain without sciatica     Problem List Patient Active Problem List   Diagnosis Date Noted  . Vitamin D deficiency 12/19/2015   Pura Spice, PT, DPT # [address], SPT 07/25/2020, 11:18 AM  [facility] [address] [place], [place], [address] Phone: [phone]   Fax:  [phone]  Name: Keren Alverio MRN: [id number] Date of Birth: Jun 22, 1966

## 2020-08-01 ENCOUNTER — Encounter: Payer: BLUE CROSS/BLUE SHIELD | Admitting: Physical Therapy

## 2020-08-08 ENCOUNTER — Encounter: Payer: BLUE CROSS/BLUE SHIELD | Admitting: Physical Therapy

## 2020-08-15 ENCOUNTER — Ambulatory Visit: Payer: BLUE CROSS/BLUE SHIELD | Admitting: Physical Therapy

## 2020-08-22 ENCOUNTER — Ambulatory Visit: Payer: BLUE CROSS/BLUE SHIELD | Attending: Neurosurgery | Admitting: Physical Therapy

## 2020-08-22 ENCOUNTER — Other Ambulatory Visit: Payer: Self-pay

## 2020-08-22 ENCOUNTER — Encounter: Payer: Self-pay | Admitting: Physical Therapy

## 2020-08-22 DIAGNOSIS — M545 Low back pain, unspecified: Secondary | ICD-10-CM | POA: Insufficient documentation

## 2020-08-22 DIAGNOSIS — M6281 Muscle weakness (generalized): Secondary | ICD-10-CM | POA: Insufficient documentation

## 2020-08-22 DIAGNOSIS — G8929 Other chronic pain: Secondary | ICD-10-CM | POA: Diagnosis present

## 2020-08-22 DIAGNOSIS — R293 Abnormal posture: Secondary | ICD-10-CM | POA: Diagnosis present

## 2020-08-22 NOTE — Therapy (Signed)
[facility] [address] [place], [place], [address] Phone: [phone]   Fax:  [phone]  Physical Therapy Treatment/Discharge Summary  Patient Details  Name: Jordan Peters MRN: [id number] Date of Birth: Jul 20, 1966 Referring Provider (PT): Meade Maw MD   Encounter Date: 08/22/2020   PT End of Session - 08/22/20 0740    Visit Number 11    Number of Visits 11    Date for PT Re-Evaluation 08/22/20    Authorization - Visit Number 1    Authorization - Number of Visits 10    PT Start Time 0732    PT Stop Time 0818    PT Time Calculation (min) 46 min    Activity Tolerance Patient tolerated treatment well;No increased pain    Behavior During Therapy WFL for tasks assessed/performed           Past Medical History:  Diagnosis Date  . Anxiety   . Back pain   . Clostridium difficile diarrhea   . Colon polyp   . Diverticulosis   . GERD (gastroesophageal reflux disease)   . Headache   . Heart murmur   . IBS (irritable bowel syndrome)   . Lumbar herniated disc   . PONV (postoperative nausea and vomiting) 12/2017  . Tremor     Past Surgical History:  Procedure Laterality Date  . COLONOSCOPY    . DILATION AND CURETTAGE OF UTERUS    . DILITATION & CURRETTAGE/HYSTROSCOPY WITH NOVASURE ABLATION N/A 03/12/2018   Procedure: fractional DILATATION & CURETTAGE/HYSTEROSCOPY WITH NOVASURE ABLATION;  Surgeon: Benjaman Kindler, MD;  Location: [facility];  Service: Gynecology;  Laterality: N/A;  . ESOPHAGOGASTRODUODENOSCOPY ENDOSCOPY    . LUMBAR LAMINECTOMY/DECOMPRESSION MICRODISCECTOMY Right 02/25/2017   Procedure: LUMBAR LAMINECTOMY/DECOMPRESSION MICRODISCECTOMY 1 LEVEL;  Surgeon: Meade Maw, MD;  Location: [facility];  Service: Neurosurgery;  Laterality: Right;  . LUMBAR LAMINECTOMY/DECOMPRESSION MICRODISCECTOMY Left 08/04/2018   Procedure: LUMBAR LAMINECTOMY/DECOMPRESSION MICRODISCECTOMY 1 LEVEL-L4-5;  Surgeon: Meade Maw, MD;  Location: [facility];  Service: Neurosurgery;  Laterality: Left;  . POLYPECTOMY    . uterine cyst      There were no vitals filed for this visit.   Subjective Assessment - 08/22/20 0737    Subjective Pt reports going on vacation and then having GI symptoms and possible covid scare which has kept pt out of therapy. Pt reports her low back has still been stiff but is feeling better. Less reports of that sharp pain she experiences in her low back.    Pertinent History Two daughters: 1 graduated Sports administrator, other is at [employer].    Limitations Sitting;Standing;Lifting;Walking    How long can you sit comfortably? 30 minutes    How long can you stand comfortably? Long enough to perform an errand in the community (i.e. grocery store)    How long can you walk comfortably? 0.5 miles    Patient Stated Goals Walk up to 5 miles a day, not have that shock-like symptoms. Pt. would like to do a cartwheel.    Currently in Pain? Yes    Pain Score 2     Pain Location Back    Pain Orientation Right;Left;Lower    Pain Descriptors / Indicators Aching;Sharp    Pain Type Chronic pain    Pain Frequency Intermittent          There.ex:   Nu-Step L4 for 8 min with UE/LE use. Not billed. Discussion on reassessing goals and future POC going onwards.    Neuro Re-Ed:  Reassessment of goals. FOTO score: 60/51, 5xSTS: 9.34 sec, reports being able to walk 2+ miles with no LBP or spasms. Still not utilizing standing desk at home with work.  Rest of session updating progressive HEP with focus on core, LE, and lumbar/thoracic mobility exercises. Pt displayed indep with new exercises requiring min verbal cues with form/technique initially but indep after cuing.   PT Education - 08/22/20 0739    Education provided Yes    Education Details form/technique with exercise.    Person(s) Educated Patient    Methods Explanation;Demonstration;Tactile cues;Verbal cues    Comprehension Verbalized understanding;Returned  demonstration               PT Long Term Goals - 08/22/20 0001      PT LONG TERM GOAL #1   Title Pt will improve FOTO score to 51 to display pt's perceived improvements with ADL's    Baseline 10/27: 60    Time 4    Period Weeks    Status Achieved    Target Date 08/22/20      PT LONG TERM GOAL #2   Title Pt will be able to walk > 2 miles with no back pain/spasms.    Baseline 10/27: able to walk 2 miles with no back pain or spasms    Time 4    Period Weeks    Status Achieved    Target Date 08/22/20      PT LONG TERM GOAL #4   Title Pt will stand at standing desk with good posture for 10 min on every hour to reduce slumped seated posture in recliner while working from home.    Baseline 10/27: Pt still not utilizing standing desk at home with work,    Time 4    Period Weeks    Status Not Met    Target Date 08/22/20      PT LONG TERM GOAL #5   Title Pt will return to be able to perform a cartwheel with no LBP or back spasms as a recreational activity.    Baseline 10/27: unable to perform    Time 4    Period Weeks    Status Not Met    Target Date 08/22/20      PT LONG TERM GOAL #6   Title Pt will improve 5xSTS score to < 7.1 sec to demonstrate improvements in functional BLE strength.    Baseline 10/27: 9.34 sec    Time 4    Period Weeks    Status Partially Met    Target Date 08/22/20                 Plan - 08/22/20 0820    Clinical Impression Statement Goals reassessed. Pt achieved FOTO goal scoring a 60 with a goal of 51 and is now able to walk 2 miles or greater with no LBP or back spasms. Pt also making good progress with 5xSTS goal performing in 9.34 sec with a goal of < 7.1 sec however originally scoring a 11.25 sec displaying significant improvement in BLE strength. Pt also reports no LBP with test where prior she has been experiencing LBP. Pt does report however still sitting for entire work days and not utilizing standing desk. Pt and PT in agreement pt  is ready for D/C from therapy. Progressive HEP has been updated and pt educated on frequency, reps, and sets for added exercise with pt verbalizing understanding and displaying indep with form/technique with updated exercise. Pt told to  contact PT for any questions or future concerns regarding LBP or HEP.    Personal Factors and Comorbidities Comorbidity 2;Past/Current Experience    Examination-Activity Limitations Bed Mobility;Bend;Carry;Stand;Stairs;Lift;Squat;Reach Overhead;Locomotion Level    Examination-Participation Restrictions Community Activity    Stability/Clinical Decision Making Evolving/Moderate complexity    Clinical Decision Making Moderate    Rehab Potential Good    PT Frequency 1x / week    PT Duration 4 weeks    PT Treatment/Interventions ADLs/Self Care Home Management;Cryotherapy;Electrical Stimulation;Moist Heat;Gait training;Stair training;Functional mobility training;Therapeutic activities;Therapeutic exercise;Balance training;Neuromuscular re-education;Patient/family education;Manual techniques;Dry needling    PT Next Visit Plan Discharge    PT Home Exercise Plan Medbridge  Access Code [facility]    Consulted and Agree with Plan of Care Patient           Patient will benefit from skilled therapeutic intervention in order to improve the following deficits and impairments:  Improper body mechanics, Pain, Increased muscle spasms, Postural dysfunction, Decreased activity tolerance, Decreased endurance, Decreased strength  Visit Diagnosis: Muscle weakness (generalized)  Abnormal posture  Chronic bilateral low back pain without sciatica     Problem List Patient Active Problem List   Diagnosis Date Noted  . Vitamin D deficiency 12/19/2015   Pura Spice, PT, DPT # [address], SPT 08/22/2020, 9:24 AM  [facility] [address] [place], [place], [address] Phone: [phone]   Fax:  [phone]  Name:  Jordan Peters MRN: [id number] Date of Birth: [birth year]

## 2020-08-30 ENCOUNTER — Other Ambulatory Visit (HOSPITAL_COMMUNITY): Payer: Self-pay | Admitting: Medical Oncology

## 2020-08-30 ENCOUNTER — Other Ambulatory Visit: Payer: Self-pay | Admitting: Medical Oncology

## 2020-08-30 DIAGNOSIS — R1011 Right upper quadrant pain: Secondary | ICD-10-CM

## 2020-09-05 ENCOUNTER — Other Ambulatory Visit: Payer: Self-pay

## 2020-09-05 ENCOUNTER — Ambulatory Visit
Admission: RE | Admit: 2020-09-05 | Discharge: 2020-09-05 | Disposition: A | Discharge status: Home / Self Care | Payer: BLUE CROSS/BLUE SHIELD | Source: Ambulatory Visit | Attending: Medical Oncology | Admitting: Medical Oncology

## 2020-09-05 DIAGNOSIS — R1011 Right upper quadrant pain: Secondary | ICD-10-CM | POA: Diagnosis present

## 2021-04-17 ENCOUNTER — Other Ambulatory Visit: Payer: Self-pay | Admitting: Obstetrics and Gynecology

## 2021-04-17 DIAGNOSIS — N644 Mastodynia: Secondary | ICD-10-CM

## 2021-04-19 ENCOUNTER — Ambulatory Visit
Admission: RE | Admit: 2021-04-19 | Discharge: 2021-04-19 | Disposition: A | Discharge status: Home / Self Care | Payer: BLUE CROSS/BLUE SHIELD | Source: Ambulatory Visit | Attending: Obstetrics and Gynecology | Admitting: Obstetrics and Gynecology

## 2021-04-19 ENCOUNTER — Other Ambulatory Visit: Payer: Self-pay

## 2021-04-19 DIAGNOSIS — N644 Mastodynia: Secondary | ICD-10-CM | POA: Insufficient documentation

## 2021-07-17 IMAGING — US US ABDOMEN COMPLETE
1 series · 14 of 25 positions shown · non-contrast
Comparison: CT abdomen/pelvis 07/03/2015.

CLINICAL DATA: Abdominal pain, right upper quadrant.

EXAM:
ABDOMEN ULTRASOUND COMPLETE

[Series 1: us abdomen complete · 14 of 87 slices shown]
[im 1/87]
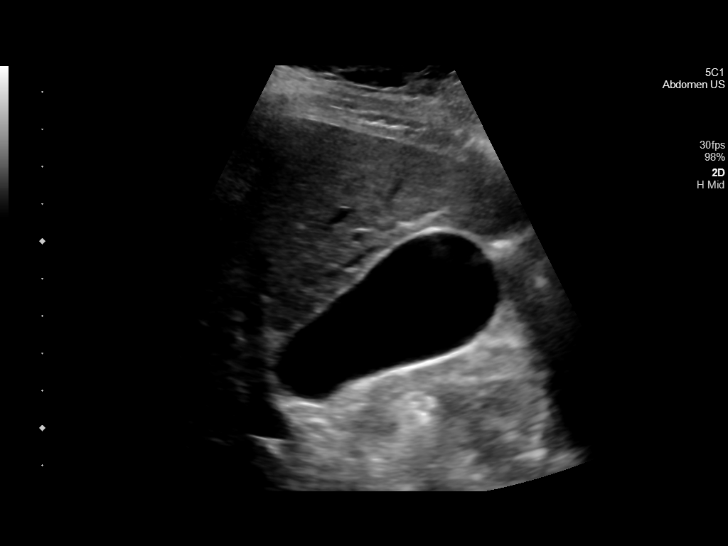
[im 8/87]
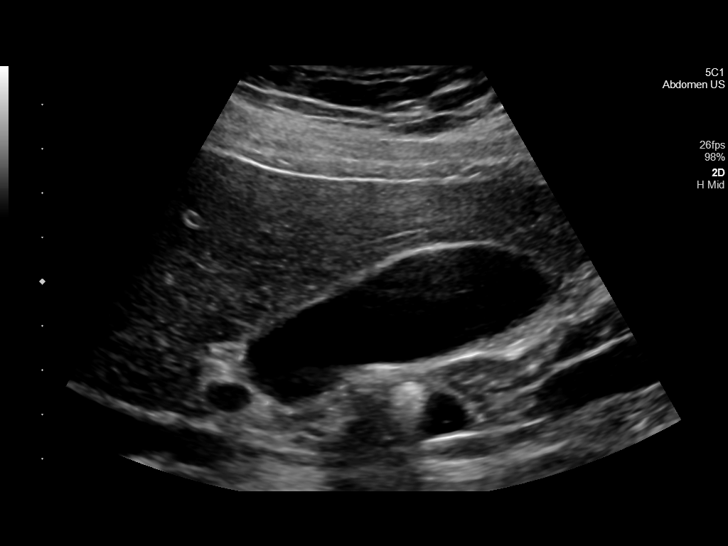
[im 15/87]
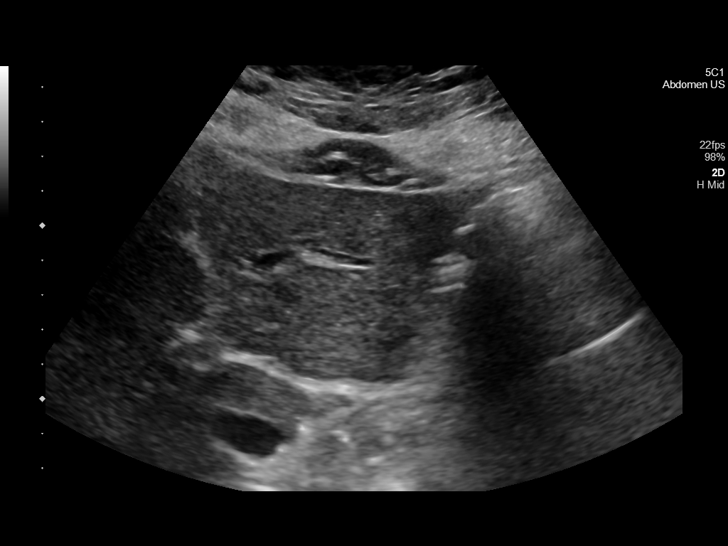
[im 22/87]
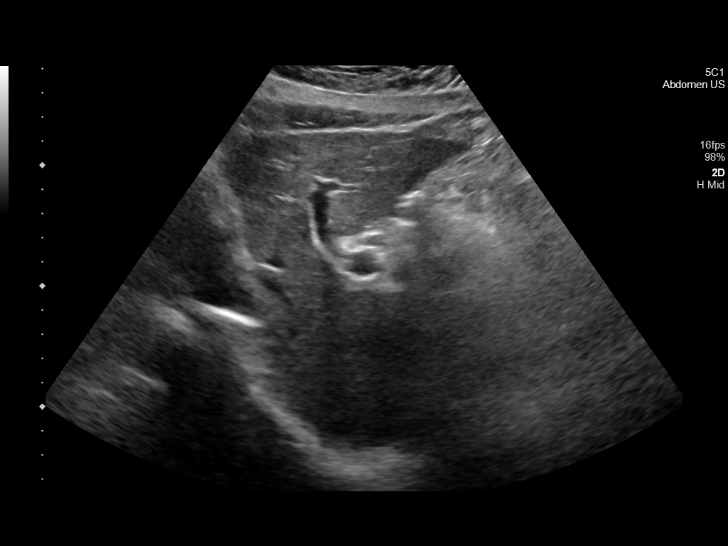
[im 29/87]
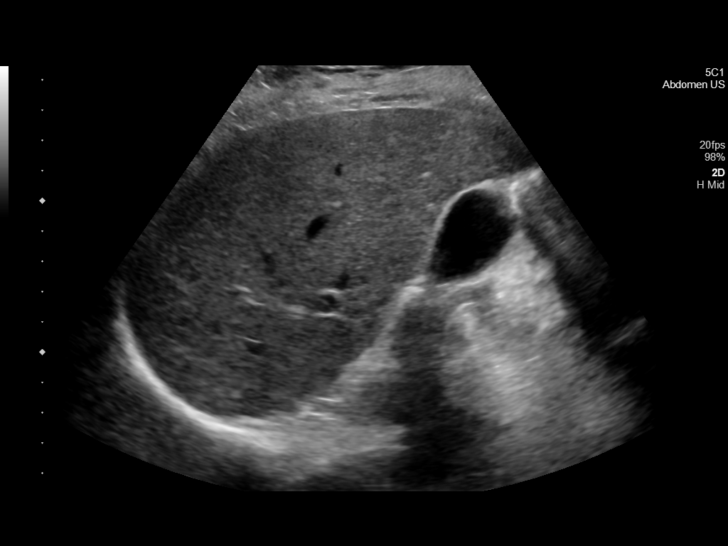
[im 33/87]
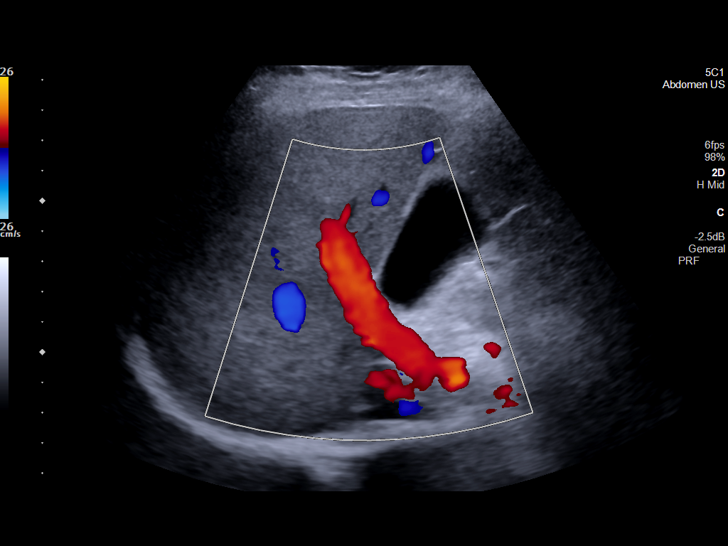
[im 40/87]
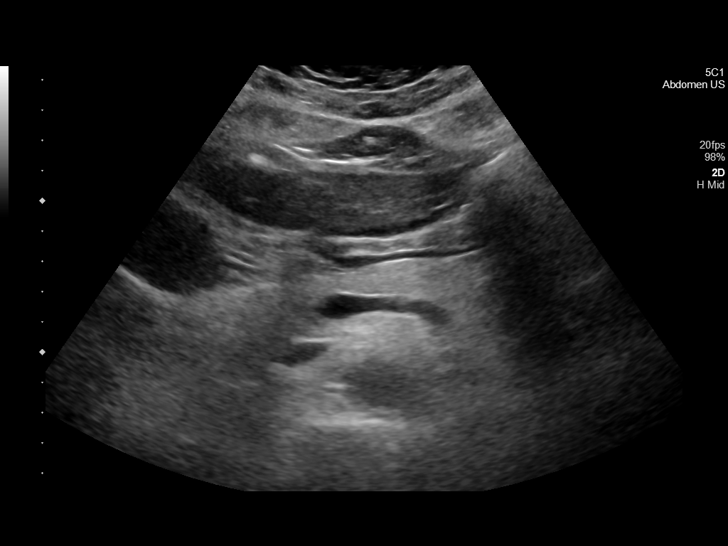
[im 47/87]
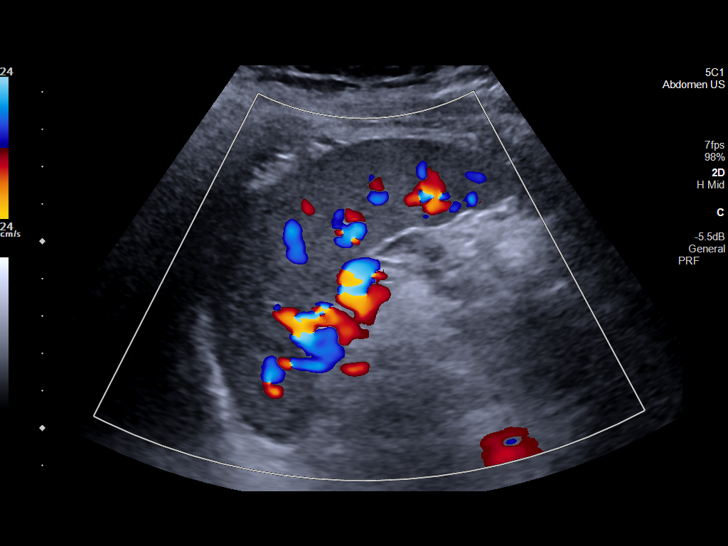
[im 54/87]
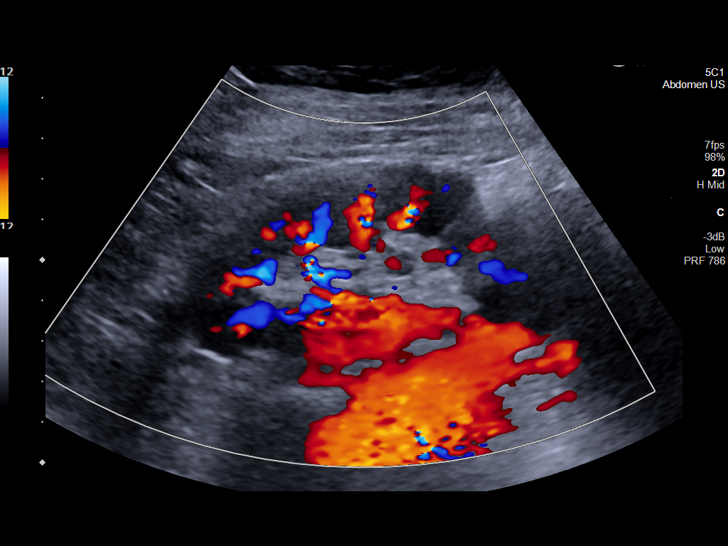
[im 58/87]
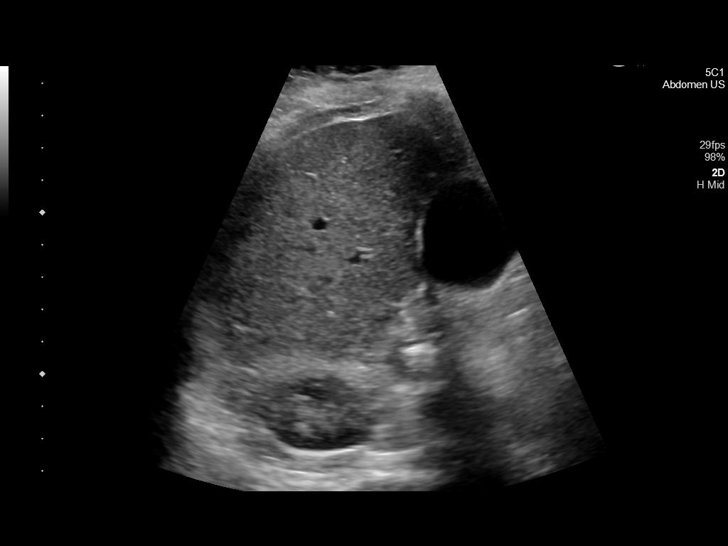
[im 65/87]
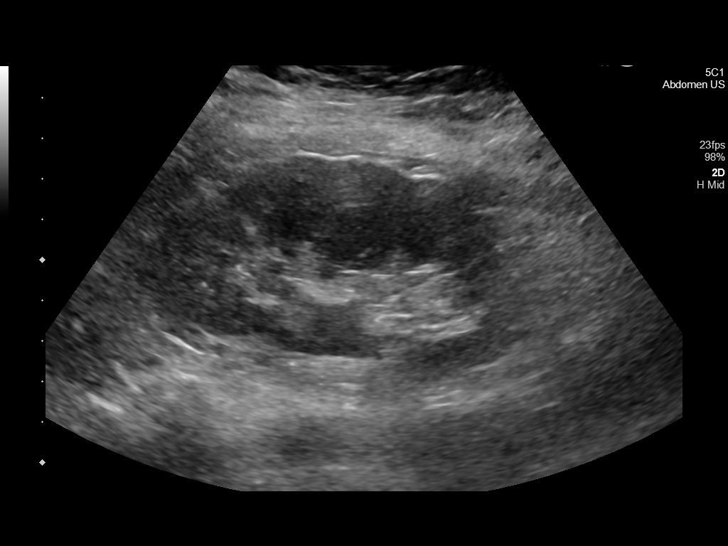
[im 72/87]
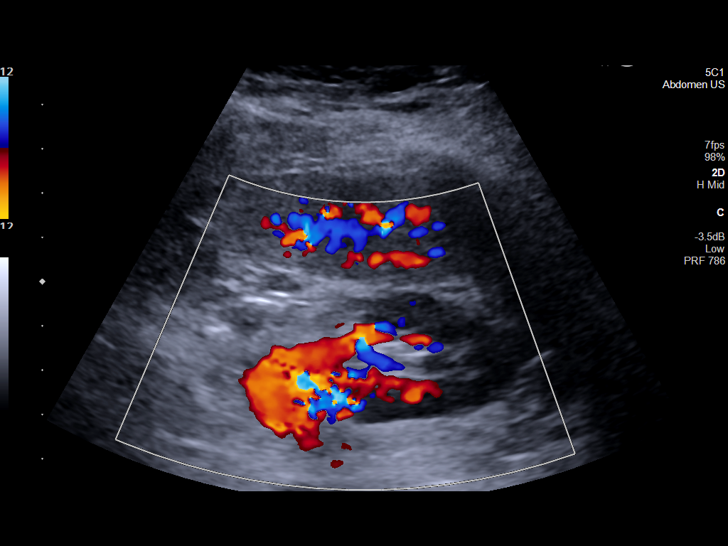
[im 79/87]
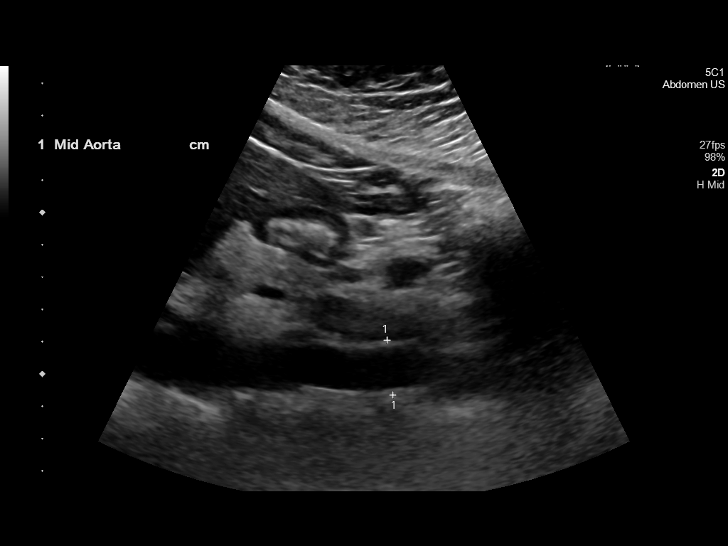
[im 87/87]
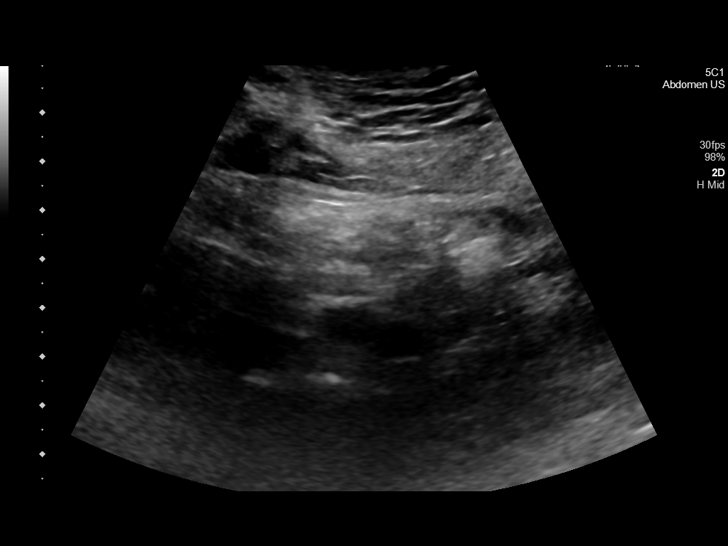

[14 of 25 positions shown; findings below may reference images not displayed]

FINDINGS: Gallbladder: No gallstones or wall thickening visualized. No
sonographic Murphy sign noted by sonographer.

Common bile duct: Diameter: 3 mm, within normal limits.

Liver: No focal lesion identified. Within normal limits in
parenchymal echogenicity. Portal vein is patent on color Doppler
imaging with normal direction of blood flow towards the liver.

IVC: No abnormality visualized.

Pancreas: Visualized portion unremarkable.

Spleen: Size and appearance within normal limits.

Right Kidney: Length: 9.4 cm. Echogenicity within normal limits. No
mass or hydronephrosis visualized.

Left Kidney: Length: 10.2 cm. Echogenicity within normal limits. No
mass or hydronephrosis visualized.

Abdominal aorta: No aneurysm visualized.
IMPRESSION: Unremarkable abdominal ultrasound, as described.

## 2022-02-27 ENCOUNTER — Other Ambulatory Visit: Payer: Self-pay

## 2022-02-27 ENCOUNTER — Observation Stay
Admission: EM | Admit: 2022-02-27 | Discharge: 2022-02-28 | Disposition: A | Discharge status: Home / Self Care | Payer: BLUE CROSS/BLUE SHIELD | Attending: Internal Medicine | Admitting: Internal Medicine

## 2022-02-27 ENCOUNTER — Encounter: Payer: Self-pay | Admitting: Emergency Medicine

## 2022-02-27 ENCOUNTER — Emergency Department: Payer: BLUE CROSS/BLUE SHIELD

## 2022-02-27 DIAGNOSIS — E876 Hypokalemia: Secondary | ICD-10-CM

## 2022-02-27 DIAGNOSIS — R55 Syncope and collapse: Principal | ICD-10-CM

## 2022-02-27 DIAGNOSIS — F419 Anxiety disorder, unspecified: Secondary | ICD-10-CM | POA: Diagnosis not present

## 2022-02-27 LAB — URINALYSIS, ROUTINE W REFLEX MICROSCOPIC
Bilirubin Urine: NEGATIVE
Glucose, UA: NEGATIVE mg/dL
Hgb urine dipstick: NEGATIVE
Ketones, ur: NEGATIVE mg/dL
Leukocytes,Ua: NEGATIVE
Nitrite: NEGATIVE
Protein, ur: NEGATIVE mg/dL
Specific Gravity, Urine: 1.026 (ref 1.005–1.030)
pH: 5 (ref 5.0–8.0)

## 2022-02-27 LAB — BASIC METABOLIC PANEL
Anion gap: 11 (ref 5–15)
BUN: 12 mg/dL (ref 6–20)
CO2: 22 mmol/L (ref 22–32)
Calcium: 9.2 mg/dL (ref 8.9–10.3)
Chloride: 105 mmol/L (ref 98–111)
Creatinine, Ser: 0.62 mg/dL (ref 0.44–1.00)
GFR, Estimated: >60 mL/min (ref >60)
Glucose, Bld: 110 mg/dL — ABNORMAL HIGH (ref 70–99)
Potassium: 3.4 mmol/L — ABNORMAL LOW (ref 3.5–5.1)
Sodium: 138 mmol/L (ref 135–145)

## 2022-02-27 LAB — CBC
HCT: 42.2 % (ref 36.0–46.0)
Hemoglobin: 13.5 g/dL (ref 12.0–15.0)
MCH: 27.4 pg (ref 26.0–34.0)
MCHC: 32 g/dL (ref 30.0–36.0)
MCV: 85.6 fL (ref 80.0–100.0)
Platelets: 406 10*3/uL — ABNORMAL HIGH (ref 150–400)
RBC: 4.93 MIL/uL (ref 3.87–5.11)
RDW: 13.2 % (ref 11.5–15.5)
WBC: 10.2 10*3/uL (ref 4.0–10.5)
nRBC: 0 % (ref 0.0–0.2)

## 2022-02-27 LAB — TROPONIN I (HIGH SENSITIVITY)
Troponin I (High Sensitivity): 4 ng/L (ref <18)
Troponin I (High Sensitivity): 4 ng/L (ref <18)

## 2022-02-27 LAB — D-DIMER, QUANTITATIVE: D-Dimer, Quant: 0.3 ug/mL-FEU (ref 0.00–0.50)

## 2022-02-27 LAB — URINE DRUG SCREEN, QUALITATIVE (ARMC ONLY)
Amphetamines, Ur Screen: NOT DETECTED
Barbiturates, Ur Screen: NOT DETECTED
Benzodiazepine, Ur Scrn: NOT DETECTED
Cannabinoid 50 Ng, Ur ~~LOC~~: NOT DETECTED
Cocaine Metabolite,Ur ~~LOC~~: NOT DETECTED
MDMA (Ecstasy)Ur Screen: NOT DETECTED
Methadone Scn, Ur: NOT DETECTED
Opiate, Ur Screen: NOT DETECTED
Phencyclidine (PCP) Ur S: NOT DETECTED
Tricyclic, Ur Screen: NOT DETECTED

## 2022-02-27 LAB — POC URINE PREG, ED: Preg Test, Ur: NEGATIVE

## 2022-02-27 LAB — GLUCOSE, CAPILLARY: Glucose-Capillary: 101 mg/dL — ABNORMAL HIGH (ref 70–99)

## 2022-02-27 LAB — MAGNESIUM: Magnesium: 2.1 mg/dL (ref 1.7–2.4)

## 2022-02-27 MED ORDER — ADULT MULTIVITAMIN W/MINERALS CH
1.0000 | ORAL_TABLET | Freq: Every day | ORAL | Status: DC
  Administered 2022-02-27 – 2022-02-28 (×2): 1 via ORAL
  Filled 2022-02-27 (×2): qty 1

## 2022-02-27 MED ORDER — RISAQUAD PO CAPS
1.0000 | ORAL_CAPSULE | Freq: Every day | ORAL | Status: DC
  Administered 2022-02-27 – 2022-02-28 (×2): 1 via ORAL
  Filled 2022-02-27 (×2): qty 1

## 2022-02-27 MED ORDER — ONDANSETRON HCL 4 MG/2ML IJ SOLN: 4.0000 mg | Freq: Three times a day (TID) | INTRAMUSCULAR | Status: DC | PRN

## 2022-02-27 MED ORDER — SODIUM CHLORIDE 0.9 % IV BOLUS
1000.0000 mL | Freq: Once | INTRAVENOUS | Status: AC
  Administered 2022-02-27: 1000 mL via INTRAVENOUS

## 2022-02-27 MED ORDER — POTASSIUM CHLORIDE CRYS ER 20 MEQ PO TBCR
40.0000 meq | EXTENDED_RELEASE_TABLET | Freq: Once | ORAL | Status: DC
  Filled 2022-02-27: qty 2

## 2022-02-27 MED ORDER — ENOXAPARIN SODIUM 40 MG/0.4ML IJ SOSY
0.5000 mg/kg | PREFILLED_SYRINGE | INTRAMUSCULAR | Status: DC
  Administered 2022-02-27: 40 mg via SUBCUTANEOUS
  Filled 2022-02-27: qty 0.4

## 2022-02-27 MED ORDER — ACETAMINOPHEN 325 MG PO TABS: 650.0000 mg | ORAL_TABLET | Freq: Four times a day (QID) | ORAL | Status: DC | PRN

## 2022-02-27 MED ORDER — ONDANSETRON HCL 4 MG/2ML IJ SOLN
4.0000 mg | Freq: Once | INTRAMUSCULAR | Status: AC
  Administered 2022-02-27: 4 mg via INTRAVENOUS
  Filled 2022-02-27: qty 2

## 2022-02-27 MED ORDER — POTASSIUM CHLORIDE 20 MEQ PO PACK
40.0000 meq | PACK | Freq: Once | ORAL | Status: AC
  Administered 2022-02-27: 40 meq via ORAL
  Filled 2022-02-27: qty 2

## 2022-02-27 MED ORDER — TRETINOIN 0.05 % EX CREA: 1.0000 "application " | TOPICAL_CREAM | Freq: Every day | CUTANEOUS | Status: DC

## 2022-02-27 MED ORDER — FLUTICASONE PROPIONATE 50 MCG/ACT NA SUSP: 1.0000 | Freq: Every day | NASAL | Status: DC | PRN

## 2022-02-27 MED ORDER — SODIUM CHLORIDE 0.9 % IV SOLN: INTRAVENOUS | Status: DC

## 2022-02-27 MED ORDER — CLONAZEPAM 0.5 MG PO TABS: 0.5000 mg | ORAL_TABLET | Freq: Two times a day (BID) | ORAL | Status: DC | PRN

## 2022-02-27 MED ORDER — ALBUTEROL SULFATE (2.5 MG/3ML) 0.083% IN NEBU: 3.0000 mL | INHALATION_SOLUTION | RESPIRATORY_TRACT | Status: DC | PRN

## 2022-02-27 MED ORDER — MELATONIN 5 MG PO TABS
2.5000 mg | ORAL_TABLET | Freq: Every day | ORAL | Status: DC
Start: 2022-02-27 — End: 2022-02-28
  Administered 2022-02-27: 2.5 mg via ORAL
  Filled 2022-02-27: qty 1

## 2022-02-27 NOTE — Assessment & Plan Note (Signed)
Potassium 3.4 ?- Repleted potassium ?-Check a magnesium level ?

## 2022-02-27 NOTE — ED Triage Notes (Signed)
Pt to ED via EMS from home c/o syncope.  EMS states they were called out for cardiac arrest but upon arrival patient was A&Ox4.  States patient was feeling faint and like she would pass out so laid self to floor.  Pt states she was panicking at home prior to.  Husband states did CPR <1 min before patient came to.  Pt clammy and dizzy upon arrival, still feeling like might pass out, urinated on her self denies seizure hx.  States has been doing hormone replacement therapy approx 1 week.  EMS vitals 123/89 BP, 117 CBG, 97% RA, NSR EKG.  Pt A&Ox4, chest rise even and unlabored, skin WNL and in NAD at this time. ?

## 2022-02-27 NOTE — ED Notes (Signed)
Sent redraw and rainbow to the lab.  ?

## 2022-02-27 NOTE — Assessment & Plan Note (Signed)
?  Etiology is not clear. The differential diagnosis is broad, including vasovagal syncope, orthostatic status,TIA, arrhythmia, ACS (less likely, trop negative x 2), drug abuse. No seizure activity per her husband.  Patient does not have any focal neurodeficit on physical examination, low suspicions for TIA or stroke. ? ?- Place on tele bed for obs ?- Orthostatic vital signs  ?- 2d echo ?- Neuro checks  ?- check UDS ?- IVF: 1L of NS, then 75 cc/h ? ?

## 2022-02-27 NOTE — ED Provider Notes (Signed)
? ?[facility] ?Provider Note ? ? ? Event Date/Time  ? First MD Initiated Contact with Patient 02/27/22 [phone]   ?  (approximate) ? ? ?History  ? ?Loss of Consciousness ? ? ?HPI ? ?Jordan Peters is a 56 y.o. female with history of panic disorder, anxiety, who comes in for loss of consciousness.  Patient reports that she was helping her daughter who had some bleeding from her finger when her daughter was about to pass out.  Then she stated that she felt like she was going to pass out and she was lowered to the ground but then according to the husband she passed out for over a minute and turned blue and he was concerned that she was not breathing and concerned enough that he started doing compressions 10-15 as well as providing breaths for the patient.  He states that he did not feel for a pulse.  If he does not feel like his compressions were very deep and she only reports a little bit of pain but increasing pain with taking a deep breath.  She denies any abdominal pain.  She reports history of syncopal episodes previously but nothing that ever is lasted this long.  Per husband it lasted about a minute.  There was no seizure-like activity no postictal period did urinate on herself but patient reports that she felt like she had to urinate prior to this episode. ? ? ?Physical Exam  ? ?Triage Vital Signs: ?ED Triage Vitals  ?Enc Vitals Group  ?   BP 02/27/22 0620 110/75  ?   Pulse Rate 02/27/22 0620 66  ?   Resp 02/27/22 0620 18  ?   Temp --   ?   Temp src --   ?   SpO2 02/27/22 0620 99 %  ?   Weight 02/27/22 0644 175 lb (79.4 kg)  ?   Height 02/27/22 0644 5' (1.524 m)  ?   Head Circumference --   ?   Peak Flow --   ?   Pain Score 02/27/22 0644 0  ?   Pain Loc --   ?   Pain Edu? --   ?   Excl. in [place]? --   ? ? ?Most recent vital signs: ?Vitals:  ? 02/27/22 0620  ?BP: 110/75  ?Pulse: 66  ?Resp: 18  ?SpO2: 99%  ? ? ? ?General: Awake, no distress.  ?CV:  Good peripheral perfusion.  ?Resp:  Normal  effort.  ?Abd:  No distention.  Soft and nontender ?Other:  Cranial nerves II through XII are intact.  Equal strength in arms and legs ? ? ?ED Results / Procedures / Treatments  ? ?Labs ?(all labs ordered are listed, but only abnormal results are displayed) ?Labs Reviewed  ?CBC - Abnormal; Notable for the following components:  ?    Result Value  ? Platelets 406 (*)   ? All other components within normal limits  ?URINALYSIS, ROUTINE W REFLEX MICROSCOPIC  ?BASIC METABOLIC PANEL  ?POC URINE PREG, ED  ?TROPONIN I (HIGH SENSITIVITY)  ?TROPONIN I (HIGH SENSITIVITY)  ? ? ? ?EKG ? ?My interpretation of EKG: ? ?Normal sinus rate of 64 without any ST elevation or T wave inversions, normal intervals ? ?RADIOLOGY ?I have reviewed the xray personally and no PNA ? ? ? ?PROCEDURES: ? ?Critical Care performed: Yes, see critical care procedure note(s) ? ?.1-3 Lead EKG Interpretation ?Performed by: Vanessa Frederic, MD ?Authorized by: Vanessa Terramuggus, MD  ? ?  Interpretation: normal   ?  ECG rate:  60 ?  ECG rate assessment: normal   ?  Rhythm: sinus rhythm   ?  Ectopy: none   ?  Conduction: normal   ? ? ?MEDICATIONS ORDERED IN ED: ?Medications  ?sodium chloride 0.9 % bolus 1,000 mL (0 mLs Intravenous Stopped 02/27/22 1114)  ?ondansetron [facility]) injection 4 mg (4 mg Intravenous Given 02/27/22 0858)  ? ? ? ?IMPRESSION / MDM / ASSESSMENT AND PLAN / ED COURSE  ?I reviewed the triage vital signs and the nursing notes. ? ? ?Patient comes in with syncopal episode.  Unclear if patient truly lost pulses but seems less likely.  However given story will get EKG, cardiac markers, x-ray to evaluate for any rib fractures, sternal fractures.  D-dimer to evaluate for PE given possible cyanosis and given recently started on estrogen ? ? ?Pregnancy test negative.  D-dimer negative.  UA with possible UTI but does have some squamous cells.  We discussed treatment for it but patient reports history of C. difficile and would like to hold off on treatment unless  urine culture is positive given she does report some symptoms a few weeks ago.  Troponin is negative. ? ?Discussed with patient given the possible potential, cardiac compressions admission for syncopal work-up which patient would like to do ? ? ?The patient is on the cardiac monitor to evaluate for evidence of arrhythmia and/or significant heart rate changes. ? ?  ? ? ?FINAL CLINICAL IMPRESSION(S) / ED DIAGNOSES  ? ?Final diagnoses:  ?Syncope and collapse  ? ? ? ?Rx / DC Orders  ? ?ED Discharge Orders   ? ? None  ? ?  ? ? ? ?Note:  This document was prepared using Dragon voice recognition software and may include unintentional dictation errors. ?  ?Vanessa Naselle, MD ?02/27/22 1146 ? ?

## 2022-02-27 NOTE — H&P (Signed)
?History and Physical  ? ? ?Jordan Peters FAO:[id number] DOB: 06/19/1966 DOA: 02/27/2022 ? ?Referring MD/NP/PA:  ? ?PCP: Orene DesanctisBehling, Karen, MD  ? ?Patient coming from:  The patient is coming from home.  At baseline, pt is independent for most of ADL.       ? ?Chief Complaint: syncope ? ?HPI: Jordan Peters is a 56 y.o. female with medical history significant of GERD, anxiety, IBS, C. difficile colitis, tremor, back pain, remote syncope, who presents with syncope. ? ?Pt states that her daughter unexpectedly came home from college in early morning. Her daughter accidentally injured her finger, with active bleeding from her daughter's finger. She is very surprised.  She started feeling faint and dizzy while helping her daughter, then passed out. Per her husband, pt was noted to be turned blue. He was concerned that she was not breathing and concerned enough that he started doing CPR compressions 10-15 as well as providing breaths for the patient. He states that he did not feel for a pulse. The patient passed out for about 1 minutes.  No seizure activity.  Patient does not have unilateral numbness or tinglings in extremities.  No facial droop or slurred speech.  She moves all extremities normally.  Patient had mild chest tightness which has resolved.  Currently no active chest pain, cough, shortness of breath.  Patient has some nausea and dry heaves, no vomiting, abdominal pain or diarrhea.  No symptoms of UTI. ? ?Data Reviewed and ED Course: pt was found to have WBC 10.2, troponin level 4, 4, negative D-dimer 0.30, urinalysis (turbid appearance, negative leukocyte, many bacteria, WBC 6-10), potassium 3.4, GFR> 60, temperature normal, blood pressure 102/74, heart rate 81, 18, oxygen saturation 96% on room air.  Chest x-ray negative. ? ? ?EKG: I have personally reviewed.  Sinus rhythm, QTc 422, no ischemic change. ? ? ?Review of Systems:  ? ?General: no fevers, chills, no body weight gain, fatigue ?HEENT: no blurry  vision, hearing changes or sore throat ?Respiratory: no dyspnea, coughing, wheezing ?CV: Has a chest tightness, no palpitations ?GI: no nausea, vomiting, abdominal pain, diarrhea, constipation ?GU: no dysuria, burning on urination, increased urinary frequency, hematuria  ?Ext: no leg edema ?Neuro: no unilateral weakness, numbness, or tingling, no vision change or hearing loss.  Has syncope ?Skin: no rash, no skin tear. ?MSK: No muscle spasm, no deformity, no limitation of range of movement in spin ?Heme: No easy bruising.  ?Travel history: No recent long distant travel. ? ? ?Allergy:  ?Allergies  ?Allergen Reactions  ? Sulfa Antibiotics Rash  ? ? ?Past Medical History:  ?Diagnosis Date  ? Anxiety   ? Back pain   ? Clostridium difficile diarrhea   ? Colon polyp   ? Diverticulosis   ? GERD (gastroesophageal reflux disease)   ? Headache   ? Heart murmur   ? IBS (irritable bowel syndrome)   ? Lumbar herniated disc   ? PONV (postoperative nausea and vomiting) 12/2017  ? Tremor   ? ? ?Past Surgical History:  ?Procedure Laterality Date  ? COLONOSCOPY    ? DILATION AND CURETTAGE OF UTERUS    ? DILITATION & CURRETTAGE/HYSTROSCOPY WITH NOVASURE ABLATION N/A 03/12/2018  ? Procedure: fractional DILATATION & CURETTAGE/HYSTEROSCOPY WITH NOVASURE ABLATION;  Surgeon: Christeen DouglasBeasley, Bethany, MD;  Location: [facility];  Service: Gynecology;  Laterality: N/A;  ? ESOPHAGOGASTRODUODENOSCOPY ENDOSCOPY    ? LUMBAR LAMINECTOMY/DECOMPRESSION MICRODISCECTOMY Right 02/25/2017  ? Procedure: LUMBAR LAMINECTOMY/DECOMPRESSION MICRODISCECTOMY 1 LEVEL;  Surgeon: Venetia Nighthester Yarbrough, MD;  Location:  [facility];  Service: Neurosurgery;  Laterality: Right;  ? LUMBAR LAMINECTOMY/DECOMPRESSION MICRODISCECTOMY Left 08/04/2018  ? Procedure: LUMBAR LAMINECTOMY/DECOMPRESSION MICRODISCECTOMY 1 LEVEL-L4-5;  Surgeon: Venetia Night, MD;  Location: [facility] ORS;  Service: Neurosurgery;  Laterality: Left;  ? POLYPECTOMY    ? uterine cyst    ? ? ?Social History:  reports that she  has never smoked. She has never used smokeless tobacco. She reports current alcohol use of about 3.0 standard drinks per week. She reports current drug use. ? ?Family History:  ?Family History  ?Problem Relation Age of Onset  ? Breast cancer Paternal Aunt   ? Hypertension Father   ? Cancer Father   ?     bladder  ? Diabetes Father   ?  ? ?Prior to Admission medications   ?Medication Sig Start Date End Date Taking? Authorizing Provider  ?albuterol (PROVENTIL HFA;VENTOLIN HFA) 108 (90 Base) MCG/ACT inhaler Inhale 2 puffs into the lungs daily. 06/22/18  Yes [provider]  ?Azelastine HCl 137 MCG/SPRAY SOLN Place 1 spray into both nostrils 2 (two) times daily. 10/30/21  Yes [provider]  ?cholestyramine Lanetta Inch) 4 GM/DOSE powder Take 1 g by mouth 2 (two) times daily with a meal.  05/20/17  Yes [provider]  ?clonazePAM (KLONOPIN) 0.5 MG tablet Take 1 tablet (0.5 mg total) by mouth 2 (two) times daily as needed (muscle spasm). 05/25/16  Yes Lutricia Feil, PA-C  ?FLOVENT HFA 110 MCG/ACT inhaler Take 1 puff by mouth daily. 05/14/18  Yes [provider]  ?fluticasone (FLONASE) 50 MCG/ACT nasal spray Place 1 spray into both nostrils daily.   Yes [provider]  ?Melatonin 5 MG TABS Take 2.5 mg by mouth at bedtime.   Yes [provider]  ?Multiple Vitamin (MULTIVITAMIN WITH MINERALS) TABS tablet Take 1 tablet by mouth daily.   Yes [provider]  ?Probiotic Product (PROBIOTIC DAILY) CAPS Take 1 capsule by mouth daily.    Yes [provider]  ?Steffanie Rainwater 90 MCG/ACT inhaler Inhale 2 puffs into the lungs in the morning and at bedtime. 10/30/21  Yes [provider]  ?tretinoin (RETIN-A) 0.05 % cream Apply 1 application topically at bedtime.  01/13/17  Yes [provider]  ?estradiol (ESTRACE) 0.1 MG/GM vaginal cream Place 0.5 g vaginally 2 (two) times a week. 12/24/21   [provider]  ?FLUoxetine (PROZAC) 10 MG  capsule Take 10 mg by mouth daily. ?Patient not taking: Reported on 02/27/2022    [provider]  ?methocarbamol (ROBAXIN) 500 MG tablet Take 1 tablet (500 mg total) by mouth every 6 (six) hours as needed for muscle spasms. ?Patient not taking: Reported on 02/27/2022 08/04/18   Ivar Drape, PA-C  ?oxyCODONE (ROXICODONE) 5 MG immediate release tablet Take 1 tablet (5 mg total) by mouth every 4 (four) hours as needed for breakthrough pain. ?Patient not taking: Reported on 02/27/2022 08/04/18   Ivar Drape, PA-C  ? ? ?Physical Exam: ?Vitals:  ? 02/27/22 1120 02/27/22 1130 02/27/22 1200 02/27/22 1649  ?BP:  102/74 105/66 (!) 131/94  ?Pulse:  77 82 71  ?Resp:  18 15 16   ?Temp: 98 ?F (36.7 ?C)   98.4 ?F (36.9 ?C)  ?TempSrc: Oral   Oral  ?SpO2:  96% 96% 100%  ?Weight:      ?Height:      ? ?General: Not in acute distress ?HEENT: ?      Eyes: PERRL, EOMI, no scleral icterus. ?      ENT: No  discharge from the ears and nose, no pharynx injection, no tonsillar enlargement.  ?      Neck: No JVD, no bruit, no mass felt. ?Heme: No neck lymph node enlargement. ?Cardiac: S1/S2, RRR, has soft systolic murmurs, No gallops or rubs. ?Respiratory: No rales, wheezing, rhonchi or rubs. ?GI: Soft, nondistended, nontender, no rebound pain, no organomegaly, BS present. ?GU: No hematuria ?Ext: No pitting leg edema bilaterally. 1+DP/PT pulse bilaterally. ?Musculoskeletal: No joint deformities, No joint redness or warmth, no limitation of ROM in spin. ?Skin: No rashes.  ?Neuro: Alert, oriented X3, cranial nerves II-XII grossly intact, moves all extremities normally. Muscle strength 5/5 in all extremities, sensation to light touch intact.  ?Psych: Patient is not psychotic, no suicidal or hemocidal ideation. ? ?Labs on Admission: I have personally reviewed following labs and imaging studies ? ?CBC: ?Recent Labs  ?Lab 02/27/22 ?0618  ?WBC 10.2  ?HGB 13.5  ?HCT 42.2  ?MCV 85.6  ?PLT 406*  ? ?Basic Metabolic Panel: ?Recent Labs  ?Lab  02/27/22 ?0729 02/27/22 ?1631  ?NA 138  --   ?K 3.4*  --   ?CL 105  --   ?CO2 22  --   ?GLUCOSE 110*  --   ?BUN 12  --   ?CREATININE 0.62  --   ?CALCIUM 9.2  --   ?MG  --  2.1  ? ?GFR: ?Estimated Creatinine [place]

## 2022-02-27 NOTE — Assessment & Plan Note (Signed)
-  prn Klonopin ?

## 2022-02-28 DIAGNOSIS — R55 Syncope and collapse: Secondary | ICD-10-CM | POA: Diagnosis not present

## 2022-02-28 LAB — HIV ANTIBODY (ROUTINE TESTING W REFLEX): HIV Screen 4th Generation wRfx: NONREACTIVE

## 2022-02-28 LAB — BASIC METABOLIC PANEL
Anion gap: 8 (ref 5–15)
BUN: 9 mg/dL (ref 6–20)
CO2: 21 mmol/L — ABNORMAL LOW (ref 22–32)
Calcium: 8.7 mg/dL — ABNORMAL LOW (ref 8.9–10.3)
Chloride: 109 mmol/L (ref 98–111)
Creatinine, Ser: 0.61 mg/dL (ref 0.44–1.00)
GFR, Estimated: >60 mL/min (ref >60)
Glucose, Bld: 89 mg/dL (ref 70–99)
Potassium: 3.9 mmol/L (ref 3.5–5.1)
Sodium: 138 mmol/L (ref 135–145)

## 2022-02-28 NOTE — Care Management (Signed)
?  Transition of Care (TOC) Screening Note ? ? ?Patient Details  ?Name: Jordan Peters ?Date of Birth: 1966-08-18 ? ? ?Transition of Care (TOC) CM/SW Contact:    ?Caryn Section, RN ?Phone Number: ?02/28/2022, 9:24 AM ? ? ? ?Transition of Care Department [facility]) has reviewed patient and no TOC needs have been identified at this time. We will continue to monitor patient advancement through interdisciplinary progression rounds. If new patient transition needs arise, please place a TOC consult. ?  ?

## 2022-02-28 NOTE — Discharge Summary (Signed)
Physician Discharge Summary  ?Jordan Peters BJS:[id number] DOB: Apr 24, 1966 DOA: 02/27/2022 ? ?PCP: Orene Desanctis, MD ? ?Admit date: 02/27/2022 ?Discharge date: 02/28/2022 ? ?Admitted From: Home ?Disposition: Home ? ?Recommendations for Outpatient Follow-up:  ?Follow up with PCP in 1-2 weeks ? ? ?Home Health: No ?Equipment/Devices: None ? ?Discharge Condition: Stable ?CODE STATUS: Full ?Diet recommendation: Regular ? ?Brief/Interim Summary: ? ?56 y.o. female with medical history significant of GERD, anxiety, IBS, C. difficile colitis, tremor, back pain, remote syncope, who presents with syncope. ?  ?Pt states that her daughter unexpectedly came home from college in early morning. Her daughter accidentally injured her finger, with active bleeding from her daughter's finger. She is very surprised.  She started feeling faint and dizzy while helping her daughter, then passed out. Per her husband, pt was noted to be turned blue. He was concerned that she was not breathing and concerned enough that he started doing CPR compressions 10-15 as well as providing breaths for the patient. He states that he did not feel for a pulse. The patient passed out for about 1 minutes.  No seizure activity.  Patient does not have unilateral numbness or tinglings in extremities.  No facial droop or slurred speech.  She moves all extremities normally.   ? ?Unclear whether patient truly lost a pulse.  Given the clinical scenario and overall negative imaging and diagnostic work-up my suspicion is that this is a vasovagal event.  She is hemodynamically stable and back to baseline at time of discharge.  No indication for echocardiogram.  Patient ambulating freely.  Blood pressure stable.  Recommend outpatient follow-up with PCP. ? ? ? ?Discharge Diagnoses:  ?Principal Problem: ?  Syncope ?Active Problems: ?  Anxiety ?  Hypokalemia ? ?* Syncope ?Suspect vasovagal event.  Do not suspect ACS.  No clear evidence of seizure activity.  Unclear whether  patient truly lost a pulse.  No neurologic deficit.  Blood pressure stable.  UDS negative.  Will defer echocardiogram outpatient.  Okay for discharge at this time.  Recommend outpatient follow-up with PCP.  Recommend awareness regarding potential for syncope and potential referral to outpatient neurology. ?  ? ?Discharge Instructions ? ?Discharge Instructions   ? ? Diet - low sodium heart healthy   Complete by: As directed ?  ? Increase activity slowly   Complete by: As directed ?  ? ?  ? ?Allergies as of 02/28/2022   ? ?   Reactions  ? Sulfa Antibiotics Rash  ? ?  ? ?  ?Medication List  ?  ? ?STOP taking these medications   ? ?FLUoxetine 10 MG capsule ?Commonly known as: PROZAC ?  ?methocarbamol 500 MG tablet ?Commonly known as: Robaxin ?  ?oxyCODONE 5 MG immediate release tablet ?Commonly known as: Roxicodone ?  ? ?  ? ?TAKE these medications   ? ?albuterol 108 (90 Base) MCG/ACT inhaler ?Commonly known as: VENTOLIN HFA ?Inhale 2 puffs into the lungs daily. ?  ?Azelastine HCl 137 MCG/SPRAY Soln ?Place 1 spray into both nostrils 2 (two) times daily. ?  ?cholestyramine 4 GM/DOSE powder ?Commonly known as: QUESTRAN ?Take 1 g by mouth 2 (two) times daily with a meal. ?  ?clonazePAM 0.5 MG tablet ?Commonly known as: KLONOPIN ?Take 1 tablet (0.5 mg total) by mouth 2 (two) times daily as needed (muscle spasm). ?  ?estradiol 0.1 MG/GM vaginal cream ?Commonly known as: ESTRACE ?Place 0.5 g vaginally 2 (two) times a week. ?  ?Flovent HFA 110 MCG/ACT inhaler ?Generic drug: fluticasone ?Take  1 puff by mouth daily. ?  ?fluticasone 50 MCG/ACT nasal spray ?Commonly known as: FLONASE ?Place 1 spray into both nostrils daily. ?  ?melatonin 5 MG Tabs ?Take 2.5 mg by mouth at bedtime. ?  ?multivitamin with minerals Tabs tablet ?Take 1 tablet by mouth daily. ?  ?Probiotic Daily Caps ?Take 1 capsule by mouth daily. ?  ?Pulmicort Flexhaler 90 MCG/ACT inhaler ?Generic drug: Budesonide ?Inhale 2 puffs into the lungs in the morning and at  bedtime. ?  ?tretinoin 0.05 % cream ?Commonly known as: RETIN-A ?Apply 1 application topically at bedtime. ?  ? ?  ? ? ?Allergies  ?Allergen Reactions  ? Sulfa Antibiotics Rash  ? ? ?Consultations: ?None ? ? ?Procedures/Studies: ?DG Chest 2 View ? ?Result Date: 02/27/2022 ?CLINICAL DATA:  Cardiac arrest EXAM: CHEST - 2 VIEW COMPARISON:  2019 FINDINGS: The heart size and mediastinal contours are within normal limits. Both lungs are clear. No pneumothorax or pleural effusion. The visualized skeletal structures are unremarkable. IMPRESSION: No active cardiopulmonary disease. Electronically Signed   By: Guadlupe SpanishPraneil  Patel M.D.   On: 02/27/2022 09:05   ? ? ? ?Subjective: ?Seen and examined on day of discharge.  Husband at bedside.  Patient stable no distress.  Stable for discharge home. ? ?Discharge Exam: ?Vitals:  ? 02/28/22 0411 02/28/22 0733  ?BP: 106/65 104/71  ?Pulse: 74 71  ?Resp: 16 18  ?Temp: 99.2 ?F (37.3 ?C) 97.9 ?F (36.6 ?C)  ?SpO2: 97% 100%  ? ?Vitals:  ? 02/27/22 1937 02/28/22 0109 02/28/22 0411 02/28/22 [id number]  ?BP: 98/65 112/63 106/65 104/71  ?Pulse: 73 75 74 71  ?Resp: 16 20 16 18   ?Temp: 98.3 ?F (36.8 ?C) 98.5 ?F (36.9 ?C) 99.2 ?F (37.3 ?C) 97.9 ?F (36.6 ?C)  ?TempSrc:    Oral  ?SpO2: 98% 99% 97% 100%  ?Weight:      ?Height:      ? ? ?General: Pt is alert, awake, not in acute distress ?Cardiovascular: RRR, S1/S2 +, no rubs, no gallops ?Respiratory: CTA bilaterally, no wheezing, no rhonchi ?Abdominal: Soft, NT, ND, bowel sounds + ?Extremities: no edema, no cyanosis ? ? ? ?The results of significant diagnostics from this hospitalization (including imaging, microbiology, ancillary and laboratory) are listed below for reference.   ? ? ?Microbiology: ?No results found for this or any previous visit (from the past 240 hour(s)).  ? ?Labs: ?BNP (last 3 results) ?No results for input(s): BNP in the last 8760 hours. ?Basic Metabolic Panel: ?Recent Labs  ?Lab 02/27/22 ?0729 02/27/22 ?1631 02/28/22 ?0507  ?NA 138  --  138   ?K 3.4*  --  3.9  ?CL 105  --  109  ?CO2 22  --  21*  ?GLUCOSE 110*  --  89  ?BUN 12  --  9  ?CREATININE 0.62  --  0.61  ?CALCIUM 9.2  --  8.7*  ?MG  --  2.1  --   ? ?Liver Function Tests: ?No results for input(s): AST, ALT, ALKPHOS, BILITOT, PROT, ALBUMIN in the last 168 hours. ?No results for input(s): LIPASE, AMYLASE in the last 168 hours. ?No results for input(s): AMMONIA in the last 168 hours. ?CBC: ?Recent Labs  ?Lab 02/27/22 ?0618  ?WBC 10.2  ?HGB 13.5  ?HCT 42.2  ?MCV 85.6  ?PLT 406*  ? ?Cardiac Enzymes: ?No results for input(s): CKTOTAL, CKMB, CKMBINDEX, TROPONINI in the last 168 hours. ?BNP: ?Invalid input(s): POCBNP ?CBG: ?Recent Labs  ?Lab 02/27/22 ?1647  ?GLUCAP 101*  ? ?D-Dimer ?Recent Labs  ?  02/27/22 ?2248  ?DDIMER 0.30  ? ?Hgb A1c ?No results for input(s): HGBA1C in the last 72 hours. ?Lipid Profile ?No results for input(s): CHOL, HDL, LDLCALC, TRIG, CHOLHDL, LDLDIRECT in the last 72 hours. ?Thyroid function studies ?No results for input(s): TSH, T4TOTAL, T3FREE, THYROIDAB in the last 72 hours. ? ?Invalid input(s): FREET3 ?Anemia work up ?No results for input(s): VITAMINB12, FOLATE, FERRITIN, TIBC, IRON, RETICCTPCT in the last 72 hours. ?Urinalysis ?   ?Component Value Date/Time  ? COLORURINE YELLOW (A) 02/27/2022 0729  ? APPEARANCEUR TURBID (A) 02/27/2022 0729  ? LABSPEC 1.026 02/27/2022 0729  ? PHURINE 5.0 02/27/2022 0729  ? GLUCOSEU NEGATIVE 02/27/2022 0729  ? HGBUR NEGATIVE 02/27/2022 0729  ? BILIRUBINUR NEGATIVE 02/27/2022 0729  ? KETONESUR NEGATIVE 02/27/2022 0729  ? PROTEINUR NEGATIVE 02/27/2022 0729  ? NITRITE NEGATIVE 02/27/2022 0729  ? LEUKOCYTESUR NEGATIVE 02/27/2022 0729  ? ?Sepsis Labs ?Invalid input(s): PROCALCITONIN,  WBC,  LACTICIDVEN ?Microbiology ?No results found for this or any previous visit (from the past 240 hour(s)). ? ? ?Time coordinating discharge: Over 30 minutes ? ?SIGNED: ? ? ?Tresa Moore, MD  ?[facility] ?02/28/2022, 2:02 PM ?Pager  ? ?If 7PM-7AM, please  contact night-coverage ? ?

## 2022-03-01 LAB — URINE CULTURE

## 2022-04-24 ENCOUNTER — Other Ambulatory Visit: Payer: Self-pay | Admitting: Obstetrics and Gynecology

## 2022-04-24 DIAGNOSIS — Z1231 Encounter for screening mammogram for malignant neoplasm of breast: Secondary | ICD-10-CM

## 2022-05-14 ENCOUNTER — Other Ambulatory Visit: Payer: Self-pay | Admitting: Neurology

## 2022-05-14 DIAGNOSIS — R55 Syncope and collapse: Secondary | ICD-10-CM

## 2022-05-14 DIAGNOSIS — H93A3 Pulsatile tinnitus, bilateral: Secondary | ICD-10-CM

## 2022-05-14 DIAGNOSIS — M542 Cervicalgia: Secondary | ICD-10-CM

## 2022-05-14 DIAGNOSIS — R202 Paresthesia of skin: Secondary | ICD-10-CM

## 2022-05-14 DIAGNOSIS — R519 Headache, unspecified: Secondary | ICD-10-CM

## 2022-05-15 ENCOUNTER — Ambulatory Visit
Admission: RE | Admit: 2022-05-15 | Discharge: 2022-05-15 | Disposition: A | Discharge status: Home / Self Care | Payer: BLUE CROSS/BLUE SHIELD | Source: Ambulatory Visit | Attending: Obstetrics and Gynecology | Admitting: Obstetrics and Gynecology

## 2022-05-15 DIAGNOSIS — Z1231 Encounter for screening mammogram for malignant neoplasm of breast: Secondary | ICD-10-CM | POA: Diagnosis present

## 2022-05-20 ENCOUNTER — Ambulatory Visit
Admission: RE | Admit: 2022-05-20 | Discharge: 2022-05-20 | Disposition: A | Discharge status: Home / Self Care | Payer: BLUE CROSS/BLUE SHIELD | Source: Ambulatory Visit | Attending: Neurology | Admitting: Neurology

## 2022-05-20 DIAGNOSIS — R519 Headache, unspecified: Secondary | ICD-10-CM | POA: Insufficient documentation

## 2022-05-20 DIAGNOSIS — H93A3 Pulsatile tinnitus, bilateral: Secondary | ICD-10-CM | POA: Insufficient documentation

## 2022-05-20 DIAGNOSIS — R202 Paresthesia of skin: Secondary | ICD-10-CM | POA: Insufficient documentation

## 2022-05-20 DIAGNOSIS — M542 Cervicalgia: Secondary | ICD-10-CM | POA: Diagnosis present

## 2022-05-20 DIAGNOSIS — R55 Syncope and collapse: Secondary | ICD-10-CM | POA: Diagnosis present

## 2022-05-20 DIAGNOSIS — R200 Anesthesia of skin: Secondary | ICD-10-CM | POA: Insufficient documentation

## 2022-05-20 MED ORDER — GADOBUTROL 1 MMOL/ML IV SOLN
7.5000 mL | Freq: Once | INTRAVENOUS | Status: AC | PRN
  Administered 2022-05-20: 7.5 mL via INTRAVENOUS

## 2023-05-06 ENCOUNTER — Other Ambulatory Visit: Payer: Self-pay | Admitting: Obstetrics and Gynecology

## 2023-05-06 DIAGNOSIS — Z1231 Encounter for screening mammogram for malignant neoplasm of breast: Secondary | ICD-10-CM

## 2023-06-01 ENCOUNTER — Ambulatory Visit
Admission: RE | Admit: 2023-06-01 | Discharge: 2023-06-01 | Disposition: A | Discharge status: Home / Self Care | Payer: BLUE CROSS/BLUE SHIELD | Source: Ambulatory Visit | Attending: Obstetrics and Gynecology | Admitting: Obstetrics and Gynecology

## 2023-06-01 DIAGNOSIS — Z1231 Encounter for screening mammogram for malignant neoplasm of breast: Secondary | ICD-10-CM | POA: Insufficient documentation

## 2023-06-12 ENCOUNTER — Other Ambulatory Visit: Payer: Self-pay | Admitting: Pediatrics

## 2023-06-12 DIAGNOSIS — Z780 Asymptomatic menopausal state: Secondary | ICD-10-CM

## 2023-06-12 DIAGNOSIS — Z8262 Family history of osteoporosis: Secondary | ICD-10-CM

## 2023-07-07 ENCOUNTER — Ambulatory Visit
Admission: RE | Admit: 2023-07-07 | Discharge: 2023-07-07 | Disposition: A | Discharge status: Home / Self Care | Payer: BLUE CROSS/BLUE SHIELD | Source: Ambulatory Visit | Attending: Pediatrics | Admitting: Pediatrics

## 2023-07-07 DIAGNOSIS — M8588 Other specified disorders of bone density and structure, other site: Secondary | ICD-10-CM | POA: Diagnosis not present

## 2023-07-07 DIAGNOSIS — Z8262 Family history of osteoporosis: Secondary | ICD-10-CM | POA: Insufficient documentation

## 2023-07-07 DIAGNOSIS — Z780 Asymptomatic menopausal state: Secondary | ICD-10-CM | POA: Insufficient documentation

## 2023-07-14 ENCOUNTER — Ambulatory Visit: Payer: BLUE CROSS/BLUE SHIELD | Attending: Pain Medicine

## 2023-07-14 DIAGNOSIS — M542 Cervicalgia: Secondary | ICD-10-CM | POA: Insufficient documentation

## 2023-07-14 DIAGNOSIS — M6281 Muscle weakness (generalized): Secondary | ICD-10-CM | POA: Diagnosis present

## 2023-07-14 NOTE — Therapy (Cosign Needed)
OUTPATIENT PHYSICAL THERAPY NECK EVALUATION   Patient Name: Jordan Peters MRN: [id number] DOB:1966/05/26, 57 y.o., female Today's Date: 07/15/2023  END OF SESSION:  PT End of Session - 07/14/23 1530     Visit Number 1    Number of Visits 17    Date for PT Re-Evaluation 09/08/23    Authorization Type BCBS 2024  VL: 60 combined PT, OT, ST, RT  Copay $40  OOP: $3000/$2213.68 used  Ref#:[id number]    PT Start Time 1535    PT Stop Time 1630    PT Time Calculation (min) 55 min    Activity Tolerance Patient tolerated treatment well;No increased pain    Behavior During Therapy WFL for tasks assessed/performed            Past Medical History:  Diagnosis Date   Anxiety    Back pain    Clostridium difficile diarrhea    Colon polyp    Diverticulosis    GERD (gastroesophageal reflux disease)    Headache    Heart murmur    IBS (irritable bowel syndrome)    Lumbar herniated disc    PONV (postoperative nausea and vomiting) 12/2017   Tremor    Past Surgical History:  Procedure Laterality Date   COLONOSCOPY     DILATION AND CURETTAGE OF UTERUS     DILITATION & CURRETTAGE/HYSTROSCOPY WITH NOVASURE ABLATION N/A 03/12/2018   Procedure: fractional DILATATION & CURETTAGE/HYSTEROSCOPY WITH NOVASURE ABLATION;  Surgeon: Christeen Douglas, MD;  Location: [facility];  Service: Gynecology;  Laterality: N/A;   ESOPHAGOGASTRODUODENOSCOPY ENDOSCOPY     LUMBAR LAMINECTOMY/DECOMPRESSION MICRODISCECTOMY Right 02/25/2017   Procedure: LUMBAR LAMINECTOMY/DECOMPRESSION MICRODISCECTOMY 1 LEVEL;  Surgeon: Venetia Night, MD;  Location: [facility];  Service: Neurosurgery;  Laterality: Right;   LUMBAR LAMINECTOMY/DECOMPRESSION MICRODISCECTOMY Left 08/04/2018   Procedure: LUMBAR LAMINECTOMY/DECOMPRESSION MICRODISCECTOMY 1 LEVEL-L4-5;  Surgeon: Venetia Night, MD;  Location: [facility];  Service: Neurosurgery;  Laterality: Left;   POLYPECTOMY     uterine cyst     Patient Active Problem List    Diagnosis Date Noted   Syncope 02/27/2022   Anxiety    Hypokalemia    Vitamin D deficiency 12/19/2015    PCP: Orene Desanctis, MD  REFERRING PROVIDER: Andi Hence*  REFERRING DIAG:  M79.18 (ICD-10-CM) - Myalgia, other site  M26.603 (ICD-10-CM) - Bilateral temporomandibular joint disorder, unspecified  H93.A3 (ICD-10-CM) - Pulsatile tinnitus, bilateral    RATIONALE FOR EVALUATION AND TREATMENT: Rehabilitation  THERAPY DIAG: Muscle weakness (generalized)  Cervicalgia  ONSET DATE: May 2023   FOLLOW-UP APPT SCHEDULED WITH REFERRING PROVIDER: Deferred    SUBJECTIVE:  Chief Complaint: Neck Pain and Jaw Pain   Pertinent History Patient arrives to physical therapy with complaints of pulsating tinnitus and neck pain with cervical rotation more on the right in comparison to the left. Patient reported syncopal episode with a controlled fall (assisted by her spouse) in May 2023, since then her symptoms haven't resolved. She went to see her primary care, who referred her to neurologist (ordered Brain MRI, MRA, MRV, CT scans). Was also referred to neurosurgery. No one could find the cause of her pulsatile tinnitus. Patient reports aural pressure, tinnitus (increases with specific movements) , intermittent popping and clicking in her neck. She's attempted ice, heat, self massage with little improvement. Patient reports that when on a stationary bike exercise she does experience episodes of dizziness. She denies chills, fever, unexplained weight loss, double vision, radiating pain. She reports in 2016 that she experienced vertigo and described the room spinning; saw an ENT for canal repositioning  CT HEART ANGIOGRAM WITH FFRCT  IMPRESSION:  1. No CT evidence of coronary stenosis or plaque. Slightly  high takeoff of the left main coronary artery at the sinotubular junction.   2. Calcium Score: 0  3. CTFFR Analysis: CT FFR will not be performed for this study  4. Variant anatomy with diminutive mid-distal LAD in the setting of a dominant first diagonal branch, which extends to the LV apex.  5. Heavily thickened, bicuspid aortic valve. Correlate with echocardiography for aortic valve sclerosis or stenosis.  6. Multiple small, sub 4 mm pulmonary nodules. If the patient is at low risk for lung cancer, no further follow-up is recommended. If the patient is at high risk for lung cancer, consider 12 month follow-up CT. (2017 Fleischner Guidelines)   MRI BRAIN WITHOUT AND WITH CONTRAST  INDICATION: Headache, new or worsening (Age >= 50y), Pulsatile tinnitus, patient had MRI 05/20/22 at [facility] and 08/13/22 at [facility] showing 3mm of enhancement in inferior L frontal lobe, possible meningioma, vascular enhancement, or met, rec continued monitoring eval if inf L frontal lobe abnormality/enhancement still present, change/progressed, H93.A3 Pulsatile tinnitus, bilateral, R90.89 Other abnormal fi  COMPARISON: MRA 09/29/2022, MR 08/13/2022, CT 05/29/2022, 04/28/2022  IMPRESSION: 1. Unchanged appearance of extra-axial enhancing lesion arising from the left orbital roof favored to represent a meningioma. 2. Stable prominence of the pituitary gland with infundibular thickening and enhancement. Differential considerations are unchanged.   Procedure: CT TEMPORAL BONE WITHOUT CONTRAST  INDICATION: Tinnitus, pulsatile, H93.A3 Pulsatile tinnitus, bilateral, H93.8X3 Other specified disorders of ear, bilateral  COMPARISON: 06/25/2022 CT venogram  IMPRESSION: No evidence of semicircular canal dehiscence or other etiology for tinnitus.    Pain:  Pain Intensity: Present: 6-7/10, Best: 1/10, Worst: 7/10 Pain location: Upper Neck  Pain Quality:  Stiffness   Radiating: No  Numbness/Tingling:  No Focal Weakness: No Aggravating factors: Prolonged Sitting  Relieving factors: Ice, heat, Self massage  24-hour pain behavior: AM: "Not as bad" PM: Activity Dependent, Stress History of prior neck injury, pain, surgery, or therapy: No Dominant hand: right Imaging: Yes  Red flags (personal history of cancer, h/o spinal tumors, history of compression fracture, chills/fever, night sweats, nausea, vomiting, unrelenting pain): Negative  PRECAUTIONS: None  WEIGHT BEARING RESTRICTIONS: No  FALLS: Has patient fallen in last 6 months? 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  Recent memory is intact.  Remote memory is intact.  Attention span and concentration are intact.  Expressive speech is intact.  Patient's fund of knowledge is within normal limits for educational level.    Gross Musculoskeletal Assessment Tremor: None Bulk: Normal Tone: Normal  Gait Deferred  Posture Mild forward head posture, rounded shoulders   AROM AROM (Normal range in degrees) AROM  Cervical  Flexion (50) WNL   Extension (80) WNL  Right lateral flexion (45) WNL* with incr tinnitus   Left lateral flexion (45) WNL*   Right rotation (85) WNL with incr tinnitus   Left rotation (85) WNL*   (* = pain; Blank rows = not tested)  PROM PROM (Normal range in degrees) PROM  Cervical  Flexion (50)   Extension (80)   Right lateral flexion (45) WNL no pain  Left lateral flexion (45) WNL no pain   Right rotation (85) WNL no pain   Left rotation  (85) WNL no pain   (* = pain; Blank rows = not tested)  MMT MMT (out of 5) Right  Left  Cervical (isometric)  Flexion WNL  Extension WNL  Lateral Flexion WNL WNL  Rotation WNL WNL      Shoulder   Flexion    Extension    Abduction    Internal rotation    Horizontal abduction    Horizontal adduction    Lower Trapezius    Rhomboids    (* = pain; Blank rows = not tested)  Sensation Grossly intact to light touch bilateral UE as determined by testing dermatomes C2-T2. Proprioception and hot/cold testing deferred on this date.  Reflexes R/L Deferred   Palpation Location LEFT  RIGHT           Suboccipitals 1 1  Cervical paraspinals 1 1  Upper Trapezius 1 1  Levator Scapulae 1 1  Rhomboid Major/Minor 1 0  SCM 1 1  (Blank rows = not tested) Graded on 0-4 scale (0 = no pain, 1 = pain, 2 = pain with wincing/grimacing/flinching, 3 = pain with withdrawal, 4 = unwilling to allow palpation), (Blank rows = not tested)  Repeated Movements No centralization or peripheralization of symptoms with repeated cervical protraction and retraction.   Passive Accessory Intervertebral Motion Pt denies reproduction of back pain with CPA C1-C7 and UPA bilaterally C1-C7. Generally, normal mobility throughout.   SPECIAL TESTS Spurlings A (ipsilateral lateral flexion/axial compression): Deferred Spurlings B (ipsilateral lateral flexion/contralateral rotation/axial compression): Deferred Distraction Test: Negative   OCULOMOTOR / VESTIBULAR TESTING  Oculomotor Exam- Room Light  Findings Comments  Ocular Alignment normal   Ocular ROM normal   Spontaneous Nystagmus normal   Gaze-Holding Nystagmus normal   End-Gaze Nystagmus normal   Vergence (normal 2-3") not examined   Smooth Pursuit normal   Cross-Cover Test not examined   Saccades normal   VOR Cancellation normal   Left Head Impulse normal   Right Head Impulse normal   Static Acuity not examined   Dynamic Acuity not examined     Oculomotor Exam- Fixation Suppressed  Findings Comments  Ocular Alignment normal   Spontaneous Nystagmus normal   Gaze-Holding Nystagmus normal   End-Gaze Nystagmus normal   Head Shaking Nystagmus normal   Pressure-Induced Nystagmus normal  Hyperventilation Induced Nystagmus normal   Skull Vibration Induced Nystagmus not examined    BPPV TESTS:  Symptoms Duration Intensity Nystagmus  L Dix-Hallpike None   None  R Dix-Hallpike None   None  L Head Roll None   None  R Head Roll None   None  L Sidelying Test None   None  R Sidelying Test None   None  (blank = not tested)    TODAY'S TREATMENT   Trigger Point Dry Needling (TDN), unbilled Education performed with patient regarding potential benefit of TDN. Reviewed precautions and risks with patient. Reviewed special precautions/risks over lung fields which include pneumothorax. Reviewed signs and symptoms of pneumothorax and advised pt to Caylon Saine to ER immediately if these symptoms develop advise them of dry needling treatment. Extensive time spent with pt to ensure full understanding of TDN risks. Pt provided verbal consent to treatment. TDN performed to SCM with 0.25 x 40 single needle placements with local twitch response (LTR). Pistoning technique utilized. Improved pain-free motion following intervention.    Manual Therapy  Light STM to bilateral SCM; utilized myofascial release, effleurage techniques.    PATIENT EDUCATION:  Education details: Plan of Care, continue current HEP Person educated: Patient Education method: Explanation Education comprehension: verbalized understanding   HOME EXERCISE PROGRAM:  Continue current HEP provided during prior PT evaluation, will be assessed and modified at first follow-up;   ASSESSMENT:  CLINICAL IMPRESSION: Patient is a 57 y.o. female who was seen today for physical therapy evaluation and treatment for neck pain and bilateral tinnitus. Currently she presents with limited muscular  endurance, impaired posture pain and bilateral tinnitus. Objective findings significant for pain and exacerbation of tinnitus in cervical AROM, specifically with right lateral flexion and rotation. No hypomobility of the cervical spine noted throughout examination. She has a slightly forward posture with rounded shoulders; palpation to upper neck muscles with notable tightness in SCM, upper trapezius, levator scapulae, rhomboids and cervical paraspinals.  PT performed oculomotor exam with no gross abnormalities noted with and without fixation suppression. All BPPV testing is negative for both vertigo and nystagmus. PT performed TDN and STM to bilateral SCM in order to assess pt response. Pt reported decreased volume of tinnitus afterward. Will assess response at follow-up. Pt will benefit from trial of skilled physical therapy 1-2x/week focused on stabilization exercises and manual techniques in order to reduce pain and assess modulation of tinnitus.   OBJECTIVE IMPAIRMENTS: decreased endurance, decreased strength, and pain.   ACTIVITY LIMITATIONS: sitting, sleeping, and bed mobility  PARTICIPATION LIMITATIONS: driving and occupation  PERSONAL FACTORS: Age, Past/current experiences, Sex, Time since onset of injury/illness/exacerbation, and 3+ comorbidities: Hx of HA, Syncopal Episodes, Anxiety   are also affecting patient's functional outcome.   REHAB POTENTIAL: Good  CLINICAL DECISION MAKING: Evolving/moderate complexity  EVALUATION COMPLEXITY: Moderate   GOALS: Goals reviewed with patient? No  SHORT TERM GOALS: Target date: 08/12/2023  Pt will be independent with HEP to improve strength and decrease neck pain to improve pain-free function at home and work. Baseline:  Goal status: INITIAL   LONG TERM GOALS: Target date: 09/09/2023  Pt will increase FOTO to at least 76 to demonstrate significant improvement in function at home and work related to neck pain  Baseline: 07/14/2023:  75 Goal status: INITIAL  2.  Pt will decrease worst neck pain by at least 2 points on the NPRS in order to demonstrate clinically significant reduction in neck pain. Baseline: 07/14/2023: 7/10 Goal status: INITIAL  3.  Pt  will decrease NDI score by at least 19% in order demonstrate clinically significant reduction in neck pain/disability.       Baseline: To be completed Goal status: INITIAL  4.  Pt will reports improvements in tinnitus and no pain with left lateral flexion and side bending in order to demonstrate improvements in functional mobility.  Baseline:  Goal status: INITIAL   PLAN: PT FREQUENCY: 1-2x/week  PT DURATION: 8 weeks  PLANNED INTERVENTIONS: Therapeutic exercises, Therapeutic activity, Neuromuscular re-education, Balance training, Gait training, Patient/Family education, Self Care, Joint mobilization, Joint manipulation, Vestibular training, Canalith repositioning, Orthotic/Fit training, DME instructions, Dry Needling, Electrical stimulation, Spinal manipulation, Spinal mobilization, Cryotherapy, Moist heat, Taping, Traction, Ultrasound, Ionotophoresis 4mg /ml Dexamethasone, Manual therapy, and Re-evaluation.  PLAN FOR NEXT SESSION: NDI, initiate stabilization program, STM to upper trapezius and SCM, dry needling to active trigger points, MMT   Thayer Ohm Sapphire Tygart SPT Lynnea Maizes PT, DPT, GCS  Huprich,Jason, PT 07/15/2023, 1:33 PM

## 2023-07-16 ENCOUNTER — Ambulatory Visit: Payer: BLUE CROSS/BLUE SHIELD

## 2023-07-22 ENCOUNTER — Ambulatory Visit: Payer: BLUE CROSS/BLUE SHIELD

## 2023-07-22 DIAGNOSIS — M6281 Muscle weakness (generalized): Secondary | ICD-10-CM

## 2023-07-22 DIAGNOSIS — M542 Cervicalgia: Secondary | ICD-10-CM

## 2023-07-22 NOTE — Therapy (Signed)
OUTPATIENT PHYSICAL THERAPY NECK TREATMENT   Patient Name: Jordan Peters MRN: [id number] DOB:[birth year], 57 y.o., female Today's Date: 07/22/2023  END OF SESSION:  PT End of Session - 07/22/23 1318     Visit Number 2    Number of Visits 17    Date for PT Re-Evaluation 09/08/23    Authorization Type BCBS 2024  VL: 60 combined PT, OT, ST, RT  Copay $40  OOP: $3000/$2213.68 used  Ref#:[id number]    PT Start Time 1315    PT Stop Time 1400    PT Time Calculation (min) 45 min    Activity Tolerance Patient tolerated treatment well;No increased pain    Behavior During Therapy WFL for tasks assessed/performed            Past Medical History:  Diagnosis Date   Anxiety    Back pain    Clostridium difficile diarrhea    Colon polyp    Diverticulosis    GERD (gastroesophageal reflux disease)    Headache    Heart murmur    IBS (irritable bowel syndrome)    Lumbar herniated disc    PONV (postoperative nausea and vomiting) 12/2017   Tremor    Past Surgical History:  Procedure Laterality Date   COLONOSCOPY     DILATION AND CURETTAGE OF UTERUS     DILITATION & CURRETTAGE/HYSTROSCOPY WITH NOVASURE ABLATION N/A 03/12/2018   Procedure: fractional DILATATION & CURETTAGE/HYSTEROSCOPY WITH NOVASURE ABLATION;  Surgeon: Christeen Douglas, MD;  Location: [facility];  Service: Gynecology;  Laterality: N/A;   ESOPHAGOGASTRODUODENOSCOPY ENDOSCOPY     LUMBAR LAMINECTOMY/DECOMPRESSION MICRODISCECTOMY Right 02/25/2017   Procedure: LUMBAR LAMINECTOMY/DECOMPRESSION MICRODISCECTOMY 1 LEVEL;  Surgeon: Venetia Night, MD;  Location: [facility];  Service: Neurosurgery;  Laterality: Right;   LUMBAR LAMINECTOMY/DECOMPRESSION MICRODISCECTOMY Left 08/04/2018   Procedure: LUMBAR LAMINECTOMY/DECOMPRESSION MICRODISCECTOMY 1 LEVEL-L4-5;  Surgeon: Venetia Night, MD;  Location: [facility];  Service: Neurosurgery;  Laterality: Left;   POLYPECTOMY     uterine cyst     Patient Active Problem List   Diagnosis  Date Noted   Syncope 02/27/2022   Anxiety    Hypokalemia    Vitamin D deficiency 12/19/2015    PCP: Orene Desanctis, MD  REFERRING PROVIDER: Andi Hence*  REFERRING DIAG:  M79.18 (ICD-10-CM) - Myalgia, other site  M26.603 (ICD-10-CM) - Bilateral temporomandibular joint disorder, unspecified  H93.A3 (ICD-10-CM) - Pulsatile tinnitus, bilateral    RATIONALE FOR EVALUATION AND TREATMENT: Rehabilitation  THERAPY DIAG: Muscle weakness (generalized)  Cervicalgia  ONSET DATE: May 2023   FOLLOW-UP APPT SCHEDULED WITH REFERRING PROVIDER: Deferred   FROM INITIAL EVALUATION SUBJECTIVE:  Chief Complaint: Neck Pain and Jaw Pain   Pertinent History Patient arrives to physical therapy with complaints of pulsating tinnitus and neck pain with cervical rotation more on the right in comparison to the left. Patient reported syncopal episode with a controlled fall (assisted by her spouse) in May 2023, since then her symptoms haven't resolved. She went to see her primary care, who referred her to neurologist (ordered Brain MRI, MRA, MRV, CT scans). Was also referred to neurosurgery. No one could find the cause of her pulsatile tinnitus. Patient reports aural pressure, tinnitus (increases with specific movements) , intermittent popping and clicking in her neck. She's attempted ice, heat, self massage with little improvement. Patient reports that when on a stationary bike exercise she does experience episodes of dizziness. She denies chills, fever, unexplained weight loss, double vision, radiating pain. She reports in 2016 that she experienced vertigo and described the room spinning; saw an ENT for canal repositioning  CT HEART ANGIOGRAM WITH FFRCT  IMPRESSION:  1. No CT evidence of coronary stenosis or plaque.  Slightly high takeoff of the left main coronary artery at the sinotubular junction.   2. Calcium Score: 0  3. CTFFR Analysis: CT FFR will not be performed for this study  4. Variant anatomy with diminutive mid-distal LAD in the setting of a dominant first diagonal branch, which extends to the LV apex.  5. Heavily thickened, bicuspid aortic valve. Correlate with echocardiography for aortic valve sclerosis or stenosis.  6. Multiple small, sub 4 mm pulmonary nodules. If the patient is at low risk for lung cancer, no further follow-up is recommended. If the patient is at high risk for lung cancer, consider 12 month follow-up CT. (2017 Fleischner Guidelines)   MRI BRAIN WITHOUT AND WITH CONTRAST  INDICATION: Headache, new or worsening (Age >= 50y), Pulsatile tinnitus, patient had MRI 05/20/22 at [facility] and 08/13/22 at [facility] showing 3mm of enhancement in inferior L frontal lobe, possible meningioma, vascular enhancement, or met, rec continued monitoring eval if inf L frontal lobe abnormality/enhancement still present, change/progressed, H93.A3 Pulsatile tinnitus, bilateral, R90.89 Other abnormal fi  COMPARISON: MRA 09/29/2022, MR 08/13/2022, CT 05/29/2022, 04/28/2022  IMPRESSION: 1. Unchanged appearance of extra-axial enhancing lesion arising from the left orbital roof favored to represent a meningioma. 2. Stable prominence of the pituitary gland with infundibular thickening and enhancement. Differential considerations are unchanged.   Procedure: CT TEMPORAL BONE WITHOUT CONTRAST  INDICATION: Tinnitus, pulsatile, H93.A3 Pulsatile tinnitus, bilateral, H93.8X3 Other specified disorders of ear, bilateral  COMPARISON: 06/25/2022 CT venogram  IMPRESSION: No evidence of semicircular canal dehiscence or other etiology for tinnitus.   Pain:  Pain Intensity: Present: 6-7/10, Best: 1/10, Worst: 7/10 Pain location: Upper Neck  Pain Quality:  Stiffness   Radiating: No   Numbness/Tingling: No Focal Weakness: No Aggravating factors: Prolonged Sitting  Relieving factors: Ice, heat, Self massage  24-hour pain behavior: AM: "Not as bad" PM: Activity Dependent, Stress History of prior neck injury, pain, surgery, or therapy: No Dominant hand: right Imaging: Yes  Red flags (personal history of cancer, h/o spinal tumors, history of compression fracture, chills/fever, night sweats, nausea, vomiting, unrelenting pain): Negative  PRECAUTIONS: None  WEIGHT BEARING RESTRICTIONS: No  FALLS: Has patient fallen in last 6 months? 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  Recent memory is intact.  Remote memory is intact.  Attention span and concentration are intact.  Expressive speech is intact.  Patient's fund of knowledge is within normal limits for educational level.    Gross Musculoskeletal Assessment Tremor: None Bulk: Normal Tone: Normal  Gait Deferred  Posture Mild forward head posture, rounded shoulders   AROM AROM (Normal range in degrees) AROM  Cervical  Flexion (50) WNL   Extension (80) WNL  Right lateral flexion (45) WNL* with incr tinnitus   Left lateral flexion (45) WNL*   Right rotation (85) WNL with incr tinnitus   Left rotation (85) WNL*   (* = pain; Blank rows = not tested)  PROM PROM (Normal range in degrees) PROM  Cervical  Flexion (50)   Extension (80)   Right lateral flexion (45) WNL no pain  Left lateral flexion (45) WNL no pain   Right rotation (85) WNL no  pain   Left rotation (85) WNL no pain   (* = pain; Blank rows = not tested)  MMT MMT (out of 5) Right  Left  Cervical (isometric)  Flexion WNL  Extension WNL  Lateral Flexion WNL WNL  Rotation WNL WNL      Shoulder   Flexion    Extension    Abduction    Internal rotation    Horizontal abduction    Horizontal adduction    Lower Trapezius    Rhomboids    (* = pain; Blank rows = not tested)  Sensation Grossly intact to light touch bilateral UE as determined by testing dermatomes C2-T2. Proprioception and hot/cold testing deferred on this date.  Reflexes R/L Deferred   Palpation Location LEFT  RIGHT           Suboccipitals 1 1  Cervical paraspinals 1 1  Upper Trapezius 1 1  Levator Scapulae 1 1  Rhomboid Major/Minor 1 0  SCM 1 1  (Blank rows = not tested) Graded on 0-4 scale (0 = no pain, 1 = pain, 2 = pain with wincing/grimacing/flinching, 3 = pain with withdrawal, 4 = unwilling to allow palpation), (Blank rows = not tested)  Repeated Movements No centralization or peripheralization of symptoms with repeated cervical protraction and retraction.   Passive Accessory Intervertebral Motion Pt denies reproduction of back pain with CPA C1-C7 and UPA bilaterally C1-C7. Generally, normal mobility throughout.   SPECIAL TESTS Spurlings A (ipsilateral lateral flexion/axial compression): Deferred Spurlings B (ipsilateral lateral flexion/contralateral rotation/axial compression): Deferred Distraction Test: Negative   OCULOMOTOR / VESTIBULAR TESTING  Oculomotor Exam- Room Light  Findings Comments  Ocular Alignment normal   Ocular ROM normal   Spontaneous Nystagmus normal   Gaze-Holding Nystagmus normal   End-Gaze Nystagmus normal   Vergence (normal 2-3") not examined   Smooth Pursuit normal   Cross-Cover Test not examined   Saccades normal   VOR Cancellation normal   Left Head Impulse normal   Right Head Impulse normal   Static Acuity not examined   Dynamic Acuity  not examined    Oculomotor Exam- Fixation Suppressed  Findings Comments  Ocular Alignment normal   Spontaneous Nystagmus normal   Gaze-Holding Nystagmus normal   End-Gaze Nystagmus normal   Head Shaking Nystagmus normal   Pressure-Induced Nystagmus normal  Hyperventilation Induced Nystagmus normal   Skull Vibration Induced Nystagmus not examined    BPPV TESTS:  Symptoms Duration Intensity Nystagmus  L Dix-Hallpike None   None  R Dix-Hallpike None   None  L Head Roll None   None  R Head Roll None   None  L Sidelying Test None   None  R Sidelying Test None   None  (blank = not tested)    TODAY'S TREATMENT    SUBJECTIVE: Pt reports that she is doing well today. She denies any neck pain upon arrival. Possible slight improvement in tinnitus after initial evaluation.   PAIN: Denies   Trigger Point Dry Needling (TDN), unbilled Education previously performed with patient regarding potential benefit of TDN. Previously reviewed precautions and risks with patient. Pt provided verbal consent to treatment. TDN performed to bilateral SCM with 4, 0.25 x 40 single needle placements (2 on each side) with local twitch response (LTR). Also performed one additional placement R suboccipitals with 1, 0.25 x 40 single needle placement. Pistoning technique utilized. .     Manual Therapy  Bilateral upper trap, lateral flexion and SCM stretches x 30s each bilateraly; Light STM to bilateral SCM and suboccipitals utilizing effleurage techniques.  Prone CPA C2-C3, grade I-II, 20s/bout x 2 bouts/level;  Ther-ex  Prone cervical retractions 3s hold x 10;  Supine cervical retractions into pillow 3s hold x 10; HEP issued and reviewed;   PATIENT EDUCATION:  Education details: Plan of care and HEP; Person educated: Patient Education method: Explanation Education comprehension: verbalized understanding   HOME EXERCISE PROGRAM:  Access Code: [email or web address] URL:  [email or web address] Date: 07/22/2023 Prepared by: Ria Comment  Exercises - Seated Upper Trapezius Stretch  - 2 x daily - 7 x weekly - 3 sets - 3 reps - 30-45s hold - Seated Levator Scapulae Stretch  - 2 x daily - 7 x weekly - 3 sets - 3 reps - 30-45s hold - Sternocleidomastoid Stretch  - 2 x daily - 7 x weekly - 3 sets - 3 reps - 30-45s hold - Sternocleidomastoid Release  - 2 x daily - 7 x weekly   ASSESSMENT:  CLINICAL IMPRESSION: Patient demonstrates excellent motivation during session. Progressed STM and repeated TDN during session. Initiated strengthening and HEP issued. Pt encouraged to follow-up as scheduled. Pt will benefit from PT services to address deficits in pain in order to return to full function at home and work.  OBJECTIVE IMPAIRMENTS: decreased endurance, decreased strength, and pain.   ACTIVITY LIMITATIONS: sitting, sleeping, and bed mobility  PARTICIPATION LIMITATIONS: driving and occupation  PERSONAL FACTORS: Age, Past/current experiences, Sex, Time since onset of injury/illness/exacerbation, and 3+ comorbidities: Hx of HA, Syncopal Episodes, Anxiety   are also affecting patient's functional outcome.   REHAB POTENTIAL: Good  CLINICAL DECISION MAKING: Evolving/moderate complexity  EVALUATION COMPLEXITY: Moderate   GOALS: Goals reviewed with patient? No  SHORT TERM GOALS: Target date: 08/19/2023  Pt will be independent with HEP to improve strength and decrease neck pain to improve pain-free function at home and work. Baseline:  Goal status: INITIAL   LONG TERM GOALS: Target date: 09/16/2023  Pt will increase FOTO to at least 76 to demonstrate significant improvement in function at home and work related to neck pain  Baseline: 07/14/2023: 75 Goal status: INITIAL  2.  Pt will decrease worst neck pain by at least 2 points on the NPRS in order to demonstrate clinically significant reduction in neck pain. Baseline: 07/14/2023: 7/10 Goal  status: INITIAL  3.  Pt will decrease NDI score by at least 19% in order demonstrate clinically significant reduction in neck pain/disability.       Baseline: To be completed Goal status: INITIAL  4.  Pt will reports improvements in tinnitus and no pain with left lateral flexion and side bending in order to demonstrate improvements in functional mobility.  Baseline:  Goal status: INITIAL   PLAN: PT FREQUENCY: 1-2x/week  PT DURATION: 8 weeks  PLANNED INTERVENTIONS: Therapeutic exercises, Therapeutic activity, Neuromuscular re-education, Balance training, Gait training, Patient/Family education, Self Care, Joint mobilization, Joint manipulation, Vestibular training, Canalith repositioning, Orthotic/Fit training, DME instructions, Dry Needling, Electrical stimulation, Spinal manipulation, Spinal mobilization, Cryotherapy, Moist heat, Taping, Traction, Ultrasound, Ionotophoresis 4mg /ml Dexamethasone, Manual therapy, and Re-evaluation.  PLAN FOR NEXT SESSION: NDI, STM to upper trapezius and SCM, dry needling to active trigger points, MMT    Lynnea Maizes PT, DPT, GCS  Roxy Mastandrea, PT 07/22/2023, 2:31 PM

## 2023-08-03 ENCOUNTER — Ambulatory Visit: Payer: BLUE CROSS/BLUE SHIELD | Attending: Pain Medicine

## 2023-08-03 DIAGNOSIS — M6281 Muscle weakness (generalized): Secondary | ICD-10-CM

## 2023-08-03 DIAGNOSIS — M542 Cervicalgia: Secondary | ICD-10-CM

## 2023-08-03 NOTE — Therapy (Signed)
OUTPATIENT PHYSICAL THERAPY NECK TREATMENT   Patient Name: Jordan Peters MRN: [id number] DOB:1966-04-21, 57 y.o., female Today's Date: 08/03/2023  END OF SESSION:  PT End of Session - 08/03/23 1315     Visit Number 3    Number of Visits 17    Date for PT Re-Evaluation 09/08/23    Authorization Type BCBS 2024  VL: 60 combined PT, OT, ST, RT  Copay $40  OOP: $3000/$2213.68 used  Ref#:[id number]    PT Start Time 1317    PT Stop Time 1400    PT Time Calculation (min) 43 min    Activity Tolerance Patient tolerated treatment well;No increased pain    Behavior During Therapy WFL for tasks assessed/performed            Past Medical History:  Diagnosis Date   Anxiety    Back pain    Clostridium difficile diarrhea    Colon polyp    Diverticulosis    GERD (gastroesophageal reflux disease)    Headache    Heart murmur    IBS (irritable bowel syndrome)    Lumbar herniated disc    PONV (postoperative nausea and vomiting) 12/2017   Tremor    Past Surgical History:  Procedure Laterality Date   COLONOSCOPY     DILATION AND CURETTAGE OF UTERUS     DILITATION & CURRETTAGE/HYSTROSCOPY WITH NOVASURE ABLATION N/A 03/12/2018   Procedure: fractional DILATATION & CURETTAGE/HYSTEROSCOPY WITH NOVASURE ABLATION;  Surgeon: Christeen Douglas, MD;  Location: [facility];  Service: Gynecology;  Laterality: N/A;   ESOPHAGOGASTRODUODENOSCOPY ENDOSCOPY     LUMBAR LAMINECTOMY/DECOMPRESSION MICRODISCECTOMY Right 02/25/2017   Procedure: LUMBAR LAMINECTOMY/DECOMPRESSION MICRODISCECTOMY 1 LEVEL;  Surgeon: Venetia Night, MD;  Location: [facility];  Service: Neurosurgery;  Laterality: Right;   LUMBAR LAMINECTOMY/DECOMPRESSION MICRODISCECTOMY Left 08/04/2018   Procedure: LUMBAR LAMINECTOMY/DECOMPRESSION MICRODISCECTOMY 1 LEVEL-L4-5;  Surgeon: Venetia Night, MD;  Location: [facility];  Service: Neurosurgery;  Laterality: Left;   POLYPECTOMY     uterine cyst     Patient Active Problem List   Diagnosis  Date Noted   Syncope 02/27/2022   Anxiety    Hypokalemia    Vitamin D deficiency 12/19/2015    PCP: Orene Desanctis, MD  REFERRING PROVIDER: Andi Hence*  REFERRING DIAG:  M79.18 (ICD-10-CM) - Myalgia, other site  M26.603 (ICD-10-CM) - Bilateral temporomandibular joint disorder, unspecified  H93.A3 (ICD-10-CM) - Pulsatile tinnitus, bilateral    RATIONALE FOR EVALUATION AND TREATMENT: Rehabilitation  THERAPY DIAG: Muscle weakness (generalized)  Cervicalgia  ONSET DATE: May 2023   FOLLOW-UP APPT SCHEDULED WITH REFERRING PROVIDER: Deferred   FROM INITIAL EVALUATION SUBJECTIVE:  Chief Complaint: Neck Pain and Jaw Pain   Pertinent History Patient arrives to physical therapy with complaints of pulsating tinnitus and neck pain with cervical rotation more on the right in comparison to the left. Patient reported syncopal episode with a controlled fall (assisted by her spouse) in May 2023, since then her symptoms haven't resolved. She went to see her primary care, who referred her to neurologist (ordered Brain MRI, MRA, MRV, CT scans). Was also referred to neurosurgery. No one could find the cause of her pulsatile tinnitus. Patient reports aural pressure, tinnitus (increases with specific movements) , intermittent popping and clicking in her neck. She's attempted ice, heat, self massage with little improvement. Patient reports that when on a stationary bike exercise she does experience episodes of dizziness. She denies chills, fever, unexplained weight loss, double vision, radiating pain. She reports in 2016 that she experienced vertigo and described the room spinning; saw an ENT for canal repositioning  CT HEART ANGIOGRAM WITH FFRCT  IMPRESSION:  1. No CT evidence of coronary stenosis or plaque.  Slightly high takeoff of the left main coronary artery at the sinotubular junction.   2. Calcium Score: 0  3. CTFFR Analysis: CT FFR will not be performed for this study  4. Variant anatomy with diminutive mid-distal LAD in the setting of a dominant first diagonal branch, which extends to the LV apex.  5. Heavily thickened, bicuspid aortic valve. Correlate with echocardiography for aortic valve sclerosis or stenosis.  6. Multiple small, sub 4 mm pulmonary nodules. If the patient is at low risk for lung cancer, no further follow-up is recommended. If the patient is at high risk for lung cancer, consider 12 month follow-up CT. (2017 Fleischner Guidelines)   MRI BRAIN WITHOUT AND WITH CONTRAST  INDICATION: Headache, new or worsening (Age >= 50y), Pulsatile tinnitus, patient had MRI 05/20/22 at [facility] and 08/13/22 at [facility] showing 3mm of enhancement in inferior L frontal lobe, possible meningioma, vascular enhancement, or met, rec continued monitoring eval if inf L frontal lobe abnormality/enhancement still present, change/progressed, H93.A3 Pulsatile tinnitus, bilateral, R90.89 Other abnormal fi  COMPARISON: MRA 09/29/2022, MR 08/13/2022, CT 05/29/2022, 04/28/2022  IMPRESSION: 1. Unchanged appearance of extra-axial enhancing lesion arising from the left orbital roof favored to represent a meningioma. 2. Stable prominence of the pituitary gland with infundibular thickening and enhancement. Differential considerations are unchanged.   Procedure: CT TEMPORAL BONE WITHOUT CONTRAST  INDICATION: Tinnitus, pulsatile, H93.A3 Pulsatile tinnitus, bilateral, H93.8X3 Other specified disorders of ear, bilateral  COMPARISON: 06/25/2022 CT venogram  IMPRESSION: No evidence of semicircular canal dehiscence or other etiology for tinnitus.   Pain:  Pain Intensity: Present: 6-7/10, Best: 1/10, Worst: 7/10 Pain location: Upper Neck  Pain Quality:  Stiffness   Radiating: No   Numbness/Tingling: No Focal Weakness: No Aggravating factors: Prolonged Sitting  Relieving factors: Ice, heat, Self massage  24-hour pain behavior: AM: "Not as bad" PM: Activity Dependent, Stress History of prior neck injury, pain, surgery, or therapy: No Dominant hand: right Imaging: Yes  Red flags (personal history of cancer, h/o spinal tumors, history of compression fracture, chills/fever, night sweats, nausea, vomiting, unrelenting pain): Negative  PRECAUTIONS: None  WEIGHT BEARING RESTRICTIONS: No  FALLS: Has patient fallen in last 6 months? 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  Recent memory is intact.  Remote memory is intact.  Attention span and concentration are intact.  Expressive speech is intact.  Patient's fund of knowledge is within normal limits for educational level.    Gross Musculoskeletal Assessment Tremor: None Bulk: Normal Tone: Normal  Gait Deferred  Posture Mild forward head posture, rounded shoulders   AROM AROM (Normal range in degrees) AROM  Cervical  Flexion (50) WNL   Extension (80) WNL  Right lateral flexion (45) WNL* with incr tinnitus   Left lateral flexion (45) WNL*   Right rotation (85) WNL with incr tinnitus   Left rotation (85) WNL*   (* = pain; Blank rows = not tested)  PROM PROM (Normal range in degrees) PROM  Cervical  Flexion (50)   Extension (80)   Right lateral flexion (45) WNL no pain  Left lateral flexion (45) WNL no pain   Right rotation (85) WNL no  pain   Left rotation (85) WNL no pain   (* = pain; Blank rows = not tested)  MMT MMT (out of 5) Right  Left  Cervical (isometric)  Flexion WNL  Extension WNL  Lateral Flexion WNL WNL  Rotation WNL WNL      Shoulder   Flexion    Extension    Abduction    Internal rotation    Horizontal abduction    Horizontal adduction    Lower Trapezius    Rhomboids    (* = pain; Blank rows = not tested)  Sensation Grossly intact to light touch bilateral UE as determined by testing dermatomes C2-T2. Proprioception and hot/cold testing deferred on this date.  Reflexes R/L Deferred   Palpation Location LEFT  RIGHT           Suboccipitals 1 1  Cervical paraspinals 1 1  Upper Trapezius 1 1  Levator Scapulae 1 1  Rhomboid Major/Minor 1 0  SCM 1 1  (Blank rows = not tested) Graded on 0-4 scale (0 = no pain, 1 = pain, 2 = pain with wincing/grimacing/flinching, 3 = pain with withdrawal, 4 = unwilling to allow palpation), (Blank rows = not tested)  Repeated Movements No centralization or peripheralization of symptoms with repeated cervical protraction and retraction.   Passive Accessory Intervertebral Motion Pt denies reproduction of back pain with CPA C1-C7 and UPA bilaterally C1-C7. Generally, normal mobility throughout.   SPECIAL TESTS Spurlings A (ipsilateral lateral flexion/axial compression): Deferred Spurlings B (ipsilateral lateral flexion/contralateral rotation/axial compression): Deferred Distraction Test: Negative   OCULOMOTOR / VESTIBULAR TESTING  Oculomotor Exam- Room Light  Findings Comments  Ocular Alignment normal   Ocular ROM normal   Spontaneous Nystagmus normal   Gaze-Holding Nystagmus normal   End-Gaze Nystagmus normal   Vergence (normal 2-3") not examined   Smooth Pursuit normal   Cross-Cover Test not examined   Saccades normal   VOR Cancellation normal   Left Head Impulse normal   Right Head Impulse normal   Static Acuity not examined   Dynamic Acuity  not examined    Oculomotor Exam- Fixation Suppressed  Findings Comments  Ocular Alignment normal   Spontaneous Nystagmus normal   Gaze-Holding Nystagmus normal   End-Gaze Nystagmus normal   Head Shaking Nystagmus normal   Pressure-Induced Nystagmus normal  Hyperventilation Induced Nystagmus normal   Skull Vibration Induced Nystagmus not examined    BPPV TESTS:  Symptoms Duration Intensity Nystagmus  L Dix-Hallpike None   None  R Dix-Hallpike None   None  L Head Roll None   None  R Head Roll None   None  L Sidelying Test None   None  R Sidelying Test None   None  (blank = not tested)    TODAY'S TREATMENT    SUBJECTIVE: Pt reports that she is doing well today. She denies any neck pain upon arrival. Possible slight improvement in tinnitus since last therapy session.   PAIN: Denies   Trigger Point Dry Needling (TDN), unbilled Education previously performed with patient regarding potential benefit of TDN. Previously reviewed precautions and risks with patient. Pt provided verbal consent to treatment. TDN performed to bilateral SCM with 4, 0.25 x 40 single needle placements (2 on each side) with local twitch response (LTR). Also performed additional placements in bilateral suboccipitals with 3, 0.25 x 40 single needle placements (2 on the right and 1 on the left). Pistoning technique utilized. .     Manual Therapy  Moist heat pack applied to cervical spine with pt in supine; Bilateral upper trap, lateral flexion and SCM stretches x 30s each bilateraly; Light STM to bilateral SCM and suboccipitals utilizing effleurage techniques.  Prone CPA C2-C3, grade I-II, 20s/bout x 2 bouts/level;   Ther-ex  UBE x 4 minutes at start of session during interval history; Supine cervical retractions into pillow with overpressure 5s hold x 5; Supine isometric cervical rotation and lateral flexion 5s hold x 5 each; HEP issued and reviewed;   PATIENT EDUCATION:  Education details: Plan of  care and HEP updates; Person educated: Patient Education method: Explanation Education comprehension: verbalized understanding   HOME EXERCISE PROGRAM:  Access Code: [email or web address] URL: [email or web address] Date: 08/03/2023 Prepared by: Ria Comment  Exercises - Seated Upper Trapezius Stretch  - 2 x daily - 7 x weekly - 3 sets - 3 reps - 30-45s hold - Seated Levator Scapulae Stretch  - 2 x daily - 7 x weekly - 3 sets - 3 reps - 30-45s hold - Sternocleidomastoid Stretch  - 2 x daily - 7 x weekly - 3 sets - 3 reps - 30-45s hold - Supine Chin Tuck  - 2 x daily - 7 x weekly - 2 sets - 10 reps - 5s hold - Sternocleidomastoid Release  - 2 x daily - 7 x weekly   ASSESSMENT:  CLINICAL IMPRESSION: Patient demonstrates excellent motivation during session. Progressed STM and repeated TDN during session. Progressed strengthening and HEP updated. Pt encouraged to follow-up as scheduled. Pt will benefit from PT services to address deficits in pain in order to return to full function at home and work.  OBJECTIVE IMPAIRMENTS: decreased endurance, decreased strength, and pain.   ACTIVITY LIMITATIONS: sitting, sleeping, and bed mobility  PARTICIPATION LIMITATIONS: driving and occupation  PERSONAL FACTORS: Age, Past/current experiences, Sex, Time since onset of injury/illness/exacerbation, and 3+ comorbidities: Hx of HA, Syncopal Episodes, Anxiety   are also affecting patient's functional outcome.   REHAB POTENTIAL: Good  CLINICAL DECISION MAKING: Evolving/moderate complexity  EVALUATION COMPLEXITY: Moderate   GOALS: Goals reviewed with patient? No  SHORT TERM GOALS: Target date: 08/31/2023  Pt will be independent with HEP to improve strength and decrease neck pain to improve pain-free function at home and work. Baseline:  Goal status: INITIAL   LONG TERM GOALS: Target date: 09/28/2023  Pt will  increase FOTO to at least 76 to demonstrate significant improvement in function at  home and work related to neck pain  Baseline: 07/14/2023: 75 Goal status: INITIAL  2.  Pt will decrease worst neck pain by at least 2 points on the NPRS in order to demonstrate clinically significant reduction in neck pain. Baseline: 07/14/2023: 7/10 Goal status: INITIAL  3.  Pt will decrease NDI score by at least 19% in order demonstrate clinically significant reduction in neck pain/disability.      Baseline: To be completed; 08/03/23: 8% Goal status: DISCONTINUED  4.  Pt will reports improvements in tinnitus and no pain with left lateral flexion and side bending in order to demonstrate improvements in functional mobility.  Baseline:  Goal status: INITIAL   PLAN: PT FREQUENCY: 1-2x/week  PT DURATION: 8 weeks  PLANNED INTERVENTIONS: Therapeutic exercises, Therapeutic activity, Neuromuscular re-education, Balance training, Gait training, Patient/Family education, Self Care, Joint mobilization, Joint manipulation, Vestibular training, Canalith repositioning, Orthotic/Fit training, DME instructions, Dry Needling, Electrical stimulation, Spinal manipulation, Spinal mobilization, Cryotherapy, Moist heat, Taping, Traction, Ultrasound, Ionotophoresis 4mg /ml Dexamethasone, Manual therapy, and Re-evaluation.  PLAN FOR NEXT SESSION: STM to upper trapezius and SCM, dry needling to active trigger points, MMT    Sharalyn Ink Kellie Murrill PT, DPT, GCS  Desiray Orchard, PT 08/03/2023, 2:24 PM

## 2023-08-05 ENCOUNTER — Ambulatory Visit: Payer: BLUE CROSS/BLUE SHIELD

## 2023-08-05 DIAGNOSIS — M6281 Muscle weakness (generalized): Secondary | ICD-10-CM

## 2023-08-05 DIAGNOSIS — M542 Cervicalgia: Secondary | ICD-10-CM

## 2023-08-05 NOTE — Therapy (Signed)
OUTPATIENT PHYSICAL THERAPY NECK TREATMENT   Patient Name: Jordan Peters MRN: [id number] DOB:Jul 23, 1966, 57 y.o., female Today's Date: 08/05/2023  END OF SESSION:  PT End of Session - 08/05/23 1314     Visit Number 4    Number of Visits 17    Date for PT Re-Evaluation 09/08/23    Authorization Type BCBS 2024  VL: 60 combined PT, OT, ST, RT  Copay $40  OOP: $3000/$2213.68 used  Ref#:[id number]    PT Start Time 1315    PT Stop Time 1400    PT Time Calculation (min) 45 min    Activity Tolerance Patient tolerated treatment well;No increased pain    Behavior During Therapy WFL for tasks assessed/performed             Past Medical History:  Diagnosis Date   Anxiety    Back pain    Clostridium difficile diarrhea    Colon polyp    Diverticulosis    GERD (gastroesophageal reflux disease)    Headache    Heart murmur    IBS (irritable bowel syndrome)    Lumbar herniated disc    PONV (postoperative nausea and vomiting) 12/2017   Tremor    Past Surgical History:  Procedure Laterality Date   COLONOSCOPY     DILATION AND CURETTAGE OF UTERUS     DILITATION & CURRETTAGE/HYSTROSCOPY WITH NOVASURE ABLATION N/A 03/12/2018   Procedure: fractional DILATATION & CURETTAGE/HYSTEROSCOPY WITH NOVASURE ABLATION;  Surgeon: Christeen Douglas, MD;  Location: [facility];  Service: Gynecology;  Laterality: N/A;   ESOPHAGOGASTRODUODENOSCOPY ENDOSCOPY     LUMBAR LAMINECTOMY/DECOMPRESSION MICRODISCECTOMY Right 02/25/2017   Procedure: LUMBAR LAMINECTOMY/DECOMPRESSION MICRODISCECTOMY 1 LEVEL;  Surgeon: Venetia Night, MD;  Location: [facility];  Service: Neurosurgery;  Laterality: Right;   LUMBAR LAMINECTOMY/DECOMPRESSION MICRODISCECTOMY Left 08/04/2018   Procedure: LUMBAR LAMINECTOMY/DECOMPRESSION MICRODISCECTOMY 1 LEVEL-L4-5;  Surgeon: Venetia Night, MD;  Location: [facility];  Service: Neurosurgery;  Laterality: Left;   POLYPECTOMY     uterine cyst     Patient Active Problem List    Diagnosis Date Noted   Syncope 02/27/2022   Anxiety    Hypokalemia    Vitamin D deficiency 12/19/2015    PCP: Orene Desanctis, MD  REFERRING PROVIDER: Andi Hence*  REFERRING DIAG:  M79.18 (ICD-10-CM) - Myalgia, other site  M26.603 (ICD-10-CM) - Bilateral temporomandibular joint disorder, unspecified  H93.A3 (ICD-10-CM) - Pulsatile tinnitus, bilateral    RATIONALE FOR EVALUATION AND TREATMENT: Rehabilitation  THERAPY DIAG: Muscle weakness (generalized)  Cervicalgia  ONSET DATE: May 2023   FOLLOW-UP APPT SCHEDULED WITH REFERRING PROVIDER: Deferred   FROM INITIAL EVALUATION SUBJECTIVE:  Chief Complaint: Neck Pain and Jaw Pain   Pertinent History Patient arrives to physical therapy with complaints of pulsating tinnitus and neck pain with cervical rotation more on the right in comparison to the left. Patient reported syncopal episode with a controlled fall (assisted by her spouse) in May 2023, since then her symptoms haven't resolved. She went to see her primary care, who referred her to neurologist (ordered Brain MRI, MRA, MRV, CT scans). Was also referred to neurosurgery. No one could find the cause of her pulsatile tinnitus. Patient reports aural pressure, tinnitus (increases with specific movements) , intermittent popping and clicking in her neck. She's attempted ice, heat, self massage with little improvement. Patient reports that when on a stationary bike exercise she does experience episodes of dizziness. She denies chills, fever, unexplained weight loss, double vision, radiating pain. She reports in 2016 that she experienced vertigo and described the room spinning; saw an ENT for canal repositioning  CT HEART ANGIOGRAM WITH FFRCT  IMPRESSION:  1. No CT evidence of coronary stenosis  or plaque. Slightly high takeoff of the left main coronary artery at the sinotubular junction.   2. Calcium Score: 0  3. CTFFR Analysis: CT FFR will not be performed for this study  4. Variant anatomy with diminutive mid-distal LAD in the setting of a dominant first diagonal branch, which extends to the LV apex.  5. Heavily thickened, bicuspid aortic valve. Correlate with echocardiography for aortic valve sclerosis or stenosis.  6. Multiple small, sub 4 mm pulmonary nodules. If the patient is at low risk for lung cancer, no further follow-up is recommended. If the patient is at high risk for lung cancer, consider 12 month follow-up CT. (2017 Fleischner Guidelines)   MRI BRAIN WITHOUT AND WITH CONTRAST  INDICATION: Headache, new or worsening (Age >= 50y), Pulsatile tinnitus, patient had MRI 05/20/22 at [facility] and 08/13/22 at [facility] showing 3mm of enhancement in inferior L frontal lobe, possible meningioma, vascular enhancement, or met, rec continued monitoring eval if inf L frontal lobe abnormality/enhancement still present, change/progressed, H93.A3 Pulsatile tinnitus, bilateral, R90.89 Other abnormal fi  COMPARISON: MRA 09/29/2022, MR 08/13/2022, CT 05/29/2022, 04/28/2022  IMPRESSION: 1. Unchanged appearance of extra-axial enhancing lesion arising from the left orbital roof favored to represent a meningioma. 2. Stable prominence of the pituitary gland with infundibular thickening and enhancement. Differential considerations are unchanged.   Procedure: CT TEMPORAL BONE WITHOUT CONTRAST  INDICATION: Tinnitus, pulsatile, H93.A3 Pulsatile tinnitus, bilateral, H93.8X3 Other specified disorders of ear, bilateral  COMPARISON: 06/25/2022 CT venogram  IMPRESSION: No evidence of semicircular canal dehiscence or other etiology for tinnitus.   Pain:  Pain Intensity: Present: 6-7/10, Best: 1/10, Worst: 7/10 Pain location: Upper Neck  Pain Quality:  Stiffness   Radiating: No   Numbness/Tingling: No Focal Weakness: No Aggravating factors: Prolonged Sitting  Relieving factors: Ice, heat, Self massage  24-hour pain behavior: AM: "Not as bad" PM: Activity Dependent, Stress History of prior neck injury, pain, surgery, or therapy: No Dominant hand: right Imaging: Yes  Red flags (personal history of cancer, h/o spinal tumors, history of compression fracture, chills/fever, night sweats, nausea, vomiting, unrelenting pain): Negative  PRECAUTIONS: None  WEIGHT BEARING RESTRICTIONS: No  FALLS: Has patient fallen in last 6 months? 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  Recent memory is intact.  Remote memory is intact.  Attention span and concentration are intact.  Expressive speech is intact.  Patient's fund of knowledge is within normal limits for educational level.    Gross Musculoskeletal Assessment Tremor: None Bulk: Normal Tone: Normal  Gait Deferred  Posture Mild forward head posture, rounded shoulders   AROM AROM (Normal range in degrees) AROM  Cervical  Flexion (50) WNL   Extension (80) WNL  Right lateral flexion (45) WNL* with incr tinnitus   Left lateral flexion (45) WNL*   Right rotation (85) WNL with incr tinnitus   Left rotation (85) WNL*   (* = pain; Blank rows = not tested)  PROM PROM (Normal range in degrees) PROM  Cervical  Flexion (50)   Extension (80)   Right lateral flexion (45) WNL no pain  Left lateral flexion (45) WNL no pain   Right rotation (85) WNL no  pain   Left rotation (85) WNL no pain   (* = pain; Blank rows = not tested)  MMT MMT (out of 5) Right  Left  Cervical (isometric)  Flexion WNL  Extension WNL  Lateral Flexion WNL WNL  Rotation WNL WNL      Shoulder   Flexion    Extension    Abduction    Internal rotation    Horizontal abduction    Horizontal adduction    Lower Trapezius    Rhomboids    (* = pain; Blank rows = not tested)  Sensation Grossly intact to light touch bilateral UE as determined by testing dermatomes C2-T2. Proprioception and hot/cold testing deferred on this date.  Reflexes R/L Deferred   Palpation Location LEFT  RIGHT           Suboccipitals 1 1  Cervical paraspinals 1 1  Upper Trapezius 1 1  Levator Scapulae 1 1  Rhomboid Major/Minor 1 0  SCM 1 1  (Blank rows = not tested) Graded on 0-4 scale (0 = no pain, 1 = pain, 2 = pain with wincing/grimacing/flinching, 3 = pain with withdrawal, 4 = unwilling to allow palpation), (Blank rows = not tested)  Repeated Movements No centralization or peripheralization of symptoms with repeated cervical protraction and retraction.   Passive Accessory Intervertebral Motion Pt denies reproduction of back pain with CPA C1-C7 and UPA bilaterally C1-C7. Generally, normal mobility throughout.   SPECIAL TESTS Spurlings A (ipsilateral lateral flexion/axial compression): Deferred Spurlings B (ipsilateral lateral flexion/contralateral rotation/axial compression): Deferred Distraction Test: Negative   OCULOMOTOR / VESTIBULAR TESTING  Oculomotor Exam- Room Light  Findings Comments  Ocular Alignment normal   Ocular ROM normal   Spontaneous Nystagmus normal   Gaze-Holding Nystagmus normal   End-Gaze Nystagmus normal   Vergence (normal 2-3") not examined   Smooth Pursuit normal   Cross-Cover Test not examined   Saccades normal   VOR Cancellation normal   Left Head Impulse normal   Right Head Impulse normal   Static Acuity not examined   Dynamic Acuity  not examined    Oculomotor Exam- Fixation Suppressed  Findings Comments  Ocular Alignment normal   Spontaneous Nystagmus normal   Gaze-Holding Nystagmus normal   End-Gaze Nystagmus normal   Head Shaking Nystagmus normal   Pressure-Induced Nystagmus normal  Hyperventilation Induced Nystagmus normal   Skull Vibration Induced Nystagmus not examined    BPPV TESTS:  Symptoms Duration Intensity Nystagmus  L Dix-Hallpike None   None  R Dix-Hallpike None   None  L Head Roll None   None  R Head Roll None   None  L Sidelying Test None   None  R Sidelying Test None   None  (blank = not tested)    TODAY'S TREATMENT    SUBJECTIVE: Pt reports that she is doing well today. She denies any neck pain upon arrival. Possible slight improvement in tinnitus since starting therapy. She would like to continue therapy to see if she achieves additional benefit.   PAIN: Denies   Trigger Point Dry Needling (TDN), unbilled Education previously performed with patient regarding potential benefit of TDN. Previously reviewed precautions and risks with patient. Pt provided verbal consent to treatment. TDN performed to bilateral SCM with 2, 0.25 x 40 single needle placements (1 on each side) with local twitch response (LTR). Also performed additional placements in bilateral upper traps (supine, anterior approach) with 2, 0.25 x 40 single needle placements (1 on each side). Pistoning technique utilized. .     Manual Therapy  Moist heat pack applied to cervical spine with pt in supine; Bilateral upper trap, lateral flexion, and levator scapulae stretches x 30s each bilateraly; Light STM to bilateral SCM and suboccipitals utilizing effleurage techniques.  Prone CPA C2-C3, grade I-II, 20s/bout x 3 bouts/level;   Ther-ex  UBE x 4 minutes at start of session during interval history; Supine cervical retractions into pillow with overpressure 5s hold x 5; Supine isotonic cervical rotation and lateral flexion  hold x 10 each; Supine isometric cervical protraction 3s hold x 10; HEP updated and reviewed;   PATIENT EDUCATION:  Education details: Plan of care and HEP updates; Person educated: Patient Education method: Explanation Education comprehension: verbalized understanding   HOME EXERCISE PROGRAM:  Access Code: [email or web address] URL: [email or web address] Date: 08/05/2023 Prepared by: Ria Comment  Exercises - Seated Upper Trapezius Stretch  - 2 x daily - 7 x weekly - 3 sets - 3 reps - 30-45s hold - Seated Levator Scapulae Stretch  - 2 x daily - 7 x weekly - 3 sets - 3 reps - 30-45s hold - Sternocleidomastoid Stretch  - 2 x daily - 7 x weekly - 3 sets - 3 reps - 30-45s hold - Sternocleidomastoid Release  - 2 x daily - 7 x weekly - Supine Chin Tuck  - 1 x daily - 7 x weekly - 2 sets - 10 reps - 5s hold - Seated Isometric Cervical Sidebending  - 1 x daily - 7 x weekly - 2 sets - 10 reps - 5s hold - Seated Isometric Cervical Rotation  - 1 x daily - 7 x weekly - 2 sets - 10 reps - 5s hold   ASSESSMENT:  CLINICAL IMPRESSION: Patient demonstrates excellent motivation during session. Progressed STM and repeated TDN during session. Additional placements in bilateral upper traps. Progressed strengthening to isotonic exercises. HEP updated. Pt encouraged to follow-up as scheduled. Pt will benefit from PT services to address deficits in pain in order to return to full function at home and work.  OBJECTIVE IMPAIRMENTS: decreased endurance, decreased strength, and pain.   ACTIVITY LIMITATIONS: sitting, sleeping, and bed mobility  PARTICIPATION LIMITATIONS: driving and occupation  PERSONAL FACTORS: Age, Past/current experiences, Sex, Time since onset of injury/illness/exacerbation, and 3+ comorbidities: Hx of HA, Syncopal Episodes, Anxiety   are  also affecting patient's functional outcome.   REHAB POTENTIAL: Good  CLINICAL DECISION MAKING: Evolving/moderate complexity  EVALUATION  COMPLEXITY: Moderate   GOALS: Goals reviewed with patient? No  SHORT TERM GOALS: Target date: 09/02/2023  Pt will be independent with HEP to improve strength and decrease neck pain to improve pain-free function at home and work. Baseline:  Goal status: INITIAL   LONG TERM GOALS: Target date: 09/30/2023  Pt will increase FOTO to at least 76 to demonstrate significant improvement in function at home and work related to neck pain  Baseline: 07/14/2023: 75 Goal status: INITIAL  2.  Pt will decrease worst neck pain by at least 2 points on the NPRS in order to demonstrate clinically significant reduction in neck pain. Baseline: 07/14/2023: 7/10 Goal status: INITIAL  3.  Pt will decrease NDI score by at least 19% in order demonstrate clinically significant reduction in neck pain/disability.      Baseline: To be completed; 08/03/23: 8% Goal status: DISCONTINUED  4.  Pt will reports improvements in tinnitus and no pain with left lateral flexion and side bending in order to demonstrate improvements in functional mobility.  Baseline:  Goal status: INITIAL   PLAN: PT FREQUENCY: 1-2x/week  PT DURATION: 8 weeks  PLANNED INTERVENTIONS: Therapeutic exercises, Therapeutic activity, Neuromuscular re-education, Balance training, Gait training, Patient/Family education, Self Care, Joint mobilization, Joint manipulation, Vestibular training, Canalith repositioning, Orthotic/Fit training, DME instructions, Dry Needling, Electrical stimulation, Spinal manipulation, Spinal mobilization, Cryotherapy, Moist heat, Taping, Traction, Ultrasound, Ionotophoresis 4mg /ml Dexamethasone, Manual therapy, and Re-evaluation.  PLAN FOR NEXT SESSION: STM to upper trapezius and SCM, dry needling to active trigger points;   Lynnea Maizes PT, DPT, GCS  Charrise Lardner, PT 08/05/2023, 2:29 PM

## 2023-08-10 ENCOUNTER — Ambulatory Visit: Payer: BLUE CROSS/BLUE SHIELD

## 2023-08-10 DIAGNOSIS — M6281 Muscle weakness (generalized): Secondary | ICD-10-CM | POA: Diagnosis not present

## 2023-08-10 DIAGNOSIS — M542 Cervicalgia: Secondary | ICD-10-CM

## 2023-08-11 NOTE — Therapy (Signed)
OUTPATIENT PHYSICAL THERAPY NECK TREATMENT   Patient Name: Jordan Peters MRN: [id number] DOB:Nov 10, 1965, 57 y.o., female Today's Date: 08/11/2023  END OF SESSION:  PT End of Session - 08/10/23 1353     Visit Number 5    Number of Visits 17    Date for PT Re-Evaluation 09/08/23    Authorization Type BCBS 2024  VL: 60 combined PT, OT, ST, RT  Copay $40  OOP: $3000/$2213.68 used  Ref#:[id number]    PT Start Time 1400    PT Stop Time 1445    PT Time Calculation (min) 45 min    Activity Tolerance Patient tolerated treatment well    Behavior During Therapy WFL for tasks assessed/performed             Past Medical History:  Diagnosis Date   Anxiety    Back pain    Clostridium difficile diarrhea    Colon polyp    Diverticulosis    GERD (gastroesophageal reflux disease)    Headache    Heart murmur    IBS (irritable bowel syndrome)    Lumbar herniated disc    PONV (postoperative nausea and vomiting) 12/2017   Tremor    Past Surgical History:  Procedure Laterality Date   COLONOSCOPY     DILATION AND CURETTAGE OF UTERUS     DILITATION & CURRETTAGE/HYSTROSCOPY WITH NOVASURE ABLATION N/A 03/12/2018   Procedure: fractional DILATATION & CURETTAGE/HYSTEROSCOPY WITH NOVASURE ABLATION;  Surgeon: Christeen Douglas, MD;  Location: [facility];  Service: Gynecology;  Laterality: N/A;   ESOPHAGOGASTRODUODENOSCOPY ENDOSCOPY     LUMBAR LAMINECTOMY/DECOMPRESSION MICRODISCECTOMY Right 02/25/2017   Procedure: LUMBAR LAMINECTOMY/DECOMPRESSION MICRODISCECTOMY 1 LEVEL;  Surgeon: Venetia Night, MD;  Location: [facility];  Service: Neurosurgery;  Laterality: Right;   LUMBAR LAMINECTOMY/DECOMPRESSION MICRODISCECTOMY Left 08/04/2018   Procedure: LUMBAR LAMINECTOMY/DECOMPRESSION MICRODISCECTOMY 1 LEVEL-L4-5;  Surgeon: Venetia Night, MD;  Location: [facility];  Service: Neurosurgery;  Laterality: Left;   POLYPECTOMY     uterine cyst     Patient Active Problem List   Diagnosis Date Noted    Syncope 02/27/2022   Anxiety    Hypokalemia    Vitamin D deficiency 12/19/2015    PCP: Orene Desanctis, MD  REFERRING PROVIDER: Andi Hence*  REFERRING DIAG:  M79.18 (ICD-10-CM) - Myalgia, other site  M26.603 (ICD-10-CM) - Bilateral temporomandibular joint disorder, unspecified  H93.A3 (ICD-10-CM) - Pulsatile tinnitus, bilateral    RATIONALE FOR EVALUATION AND TREATMENT: Rehabilitation  THERAPY DIAG: Muscle weakness (generalized)  Cervicalgia  ONSET DATE: May 2023   FOLLOW-UP APPT SCHEDULED WITH REFERRING PROVIDER: Deferred   FROM INITIAL EVALUATION SUBJECTIVE:  Chief Complaint: Neck Pain and Jaw Pain   Pertinent History Patient arrives to physical therapy with complaints of pulsating tinnitus and neck pain with cervical rotation more on the right in comparison to the left. Patient reported syncopal episode with a controlled fall (assisted by her spouse) in May 2023, since then her symptoms haven't resolved. She went to see her primary care, who referred her to neurologist (ordered Brain MRI, MRA, MRV, CT scans). Was also referred to neurosurgery. No one could find the cause of her pulsatile tinnitus. Patient reports aural pressure, tinnitus (increases with specific movements) , intermittent popping and clicking in her neck. She's attempted ice, heat, self massage with little improvement. Patient reports that when on a stationary bike exercise she does experience episodes of dizziness. She denies chills, fever, unexplained weight loss, double vision, radiating pain. She reports in 2016 that she experienced vertigo and described the room spinning; saw an ENT for canal repositioning  CT HEART ANGIOGRAM WITH FFRCT  IMPRESSION:  1. No CT evidence of coronary stenosis or plaque. Slightly high  takeoff of the left main coronary artery at the sinotubular junction.   2. Calcium Score: 0  3. CTFFR Analysis: CT FFR will not be performed for this study  4. Variant anatomy with diminutive mid-distal LAD in the setting of a dominant first diagonal branch, which extends to the LV apex.  5. Heavily thickened, bicuspid aortic valve. Correlate with echocardiography for aortic valve sclerosis or stenosis.  6. Multiple small, sub 4 mm pulmonary nodules. If the patient is at low risk for lung cancer, no further follow-up is recommended. If the patient is at high risk for lung cancer, consider 12 month follow-up CT. (2017 Fleischner Guidelines)   MRI BRAIN WITHOUT AND WITH CONTRAST  INDICATION: Headache, new or worsening (Age >= 50y), Pulsatile tinnitus, patient had MRI 05/20/22 at [facility] and 08/13/22 at [facility] showing 3mm of enhancement in inferior L frontal lobe, possible meningioma, vascular enhancement, or met, rec continued monitoring eval if inf L frontal lobe abnormality/enhancement still present, change/progressed, H93.A3 Pulsatile tinnitus, bilateral, R90.89 Other abnormal fi  COMPARISON: MRA 09/29/2022, MR 08/13/2022, CT 05/29/2022, 04/28/2022  IMPRESSION: 1. Unchanged appearance of extra-axial enhancing lesion arising from the left orbital roof favored to represent a meningioma. 2. Stable prominence of the pituitary gland with infundibular thickening and enhancement. Differential considerations are unchanged.   Procedure: CT TEMPORAL BONE WITHOUT CONTRAST  INDICATION: Tinnitus, pulsatile, H93.A3 Pulsatile tinnitus, bilateral, H93.8X3 Other specified disorders of ear, bilateral  COMPARISON: 06/25/2022 CT venogram  IMPRESSION: No evidence of semicircular canal dehiscence or other etiology for tinnitus.   Pain:  Pain Intensity: Present: 6-7/10, Best: 1/10, Worst: 7/10 Pain location: Upper Neck  Pain Quality:  Stiffness   Radiating: No  Numbness/Tingling: No Focal  Weakness: No Aggravating factors: Prolonged Sitting  Relieving factors: Ice, heat, Self massage  24-hour pain behavior: AM: "Not as bad" PM: Activity Dependent, Stress History of prior neck injury, pain, surgery, or therapy: No Dominant hand: right Imaging: Yes  Red flags (personal history of cancer, h/o spinal tumors, history of compression fracture, chills/fever, night sweats, nausea, vomiting, unrelenting pain): Negative  PRECAUTIONS: None  WEIGHT BEARING RESTRICTIONS: No  FALLS: Has patient fallen in last 6 months? 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  Recent memory is intact.  Remote memory is intact.  Attention span and concentration are intact.  Expressive speech is intact.  Patient's fund of knowledge is within normal limits for educational level.    Gross Musculoskeletal Assessment Tremor: None Bulk: Normal Tone: Normal  Gait Deferred  Posture Mild forward head posture, rounded shoulders   AROM AROM (Normal range in degrees) AROM  Cervical  Flexion (50) WNL   Extension (80) WNL  Right lateral flexion (45) WNL* with incr tinnitus   Left lateral flexion (45) WNL*   Right rotation (85) WNL with incr tinnitus   Left rotation (85) WNL*   (* = pain; Blank rows = not tested)  PROM PROM (Normal range in degrees) PROM  Cervical  Flexion (50)   Extension (80)   Right lateral flexion (45) WNL no pain  Left lateral flexion (45) WNL no pain   Right rotation (85) WNL no pain   Left rotation (85) WNL  no pain   (* = pain; Blank rows = not tested)  MMT MMT (out of 5) Right  Left  Cervical (isometric)  Flexion WNL  Extension WNL  Lateral Flexion WNL WNL  Rotation WNL WNL      Shoulder   Flexion    Extension    Abduction    Internal rotation    Horizontal abduction    Horizontal adduction    Lower Trapezius    Rhomboids    (* = pain; Blank rows = not tested)  Sensation Grossly intact to light touch bilateral UE as determined by testing dermatomes C2-T2. Proprioception and hot/cold testing deferred on this date.  Reflexes R/L Deferred   Palpation Location LEFT  RIGHT           Suboccipitals 1 1  Cervical paraspinals 1 1  Upper Trapezius 1 1  Levator Scapulae 1 1  Rhomboid Major/Minor 1 0  SCM 1 1  (Blank rows = not tested) Graded on 0-4 scale (0 = no pain, 1 = pain, 2 = pain with wincing/grimacing/flinching, 3 = pain with withdrawal, 4 = unwilling to allow palpation), (Blank rows = not tested)  Repeated Movements No centralization or peripheralization of symptoms with repeated cervical protraction and retraction.   Passive Accessory Intervertebral Motion Pt denies reproduction of back pain with CPA C1-C7 and UPA bilaterally C1-C7. Generally, normal mobility throughout.   SPECIAL TESTS Spurlings A (ipsilateral lateral flexion/axial compression): Deferred Spurlings B (ipsilateral lateral flexion/contralateral rotation/axial compression): Deferred Distraction Test: Negative   OCULOMOTOR / VESTIBULAR TESTING  Oculomotor Exam- Room Light  Findings Comments  Ocular Alignment normal   Ocular ROM normal   Spontaneous Nystagmus normal   Gaze-Holding Nystagmus normal   End-Gaze Nystagmus normal   Vergence (normal 2-3") not examined   Smooth Pursuit normal   Cross-Cover Test not examined   Saccades normal   VOR Cancellation normal   Left Head Impulse normal   Right Head Impulse normal   Static Acuity not examined   Dynamic Acuity not examined    Oculomotor  Exam- Fixation Suppressed  Findings Comments  Ocular Alignment normal   Spontaneous Nystagmus normal   Gaze-Holding Nystagmus normal   End-Gaze Nystagmus normal   Head Shaking Nystagmus normal   Pressure-Induced Nystagmus normal  Hyperventilation Induced Nystagmus normal   Skull Vibration Induced Nystagmus not examined    BPPV TESTS:  Symptoms Duration Intensity Nystagmus  L Dix-Hallpike None   None  R Dix-Hallpike None   None  L Head Roll None   None  R Head Roll None   None  L Sidelying Test None   None  R Sidelying Test None   None  (blank = not tested)    TODAY'S TREATMENT    SUBJECTIVE: Pt reports that she is doing well today. She denies any resting neck pain upon arrival. Possible slight improvement in tinnitus since starting therapy however it remains highly variable depending on the day and her activities.    PAIN: Denies   Trigger Point Dry Needling (TDN), unbilled Education previously performed with patient regarding potential benefit of TDN. Previously reviewed precautions and risks with patient. Pt provided verbal consent to treatment. TDN performed to bilateral SCM with 2, 0.25 x 40 single needle placements (1 on each side) with local twitch response (LTR). Also performed additional placements in bilateral upper traps (supine, anterior approach) with 2, 0.25 x 40 single needle placements (1 on each side) and suboccipitals with 2, 0.25 x 40 single needle placements (one on each side). Pistoning technique utilized.     Manual Therapy  Moist heat pack applied to cervical spine with pt in supine; Bilateral upper trap, lateral flexion, and levator scapulae stretches x 30s each bilateraly; Light STM to bilateral SCM and suboccipitals utilizing effleurage techniques.  Prone CPA C2-C3, grade I-II, 20s/bout x 3 bouts/level;   Ther-ex  Supine cervical retractions into pillow with overpressure 3s hold x 10; Supine isotonic cervical rotation and lateral flexion hold x  10 each;   PATIENT EDUCATION:  Education details: Pt educated throughout session about proper posture and technique with exercises. Improved exercise technique, movement at target joints, use of target muscles after min to mod verbal, visual, tactile cues.  Person educated: Patient Education method: Explanation Education comprehension: verbalized understanding   HOME EXERCISE PROGRAM:  Access Code: [email or web address] URL: [email or web address] Date: 08/05/2023 Prepared by: Ria Comment  Exercises - Seated Upper Trapezius Stretch  - 2 x daily - 7 x weekly - 3 sets - 3 reps - 30-45s hold - Seated Levator Scapulae Stretch  - 2 x daily - 7 x weekly - 3 sets - 3 reps - 30-45s hold - Sternocleidomastoid Stretch  - 2 x daily - 7 x weekly - 3 sets - 3 reps - 30-45s hold - Sternocleidomastoid Release  - 2 x daily - 7 x weekly - Supine Chin Tuck  - 1 x daily - 7 x weekly - 2 sets - 10 reps - 5s hold - Seated Isometric Cervical Sidebending  - 1 x daily - 7 x weekly - 2 sets - 10 reps - 5s hold - Seated Isometric Cervical Rotation  - 1 x daily - 7 x weekly - 2 sets - 10 reps - 5s hold   ASSESSMENT:  CLINICAL IMPRESSION: Patient demonstrates excellent motivation during session. Progressed STM and repeated TDN during session. Continued isontonic cervical strengthening. Plan to progress to general upper quarter strengthening at next session. No HEP updates on this date. Pt encouraged to follow-up as scheduled. Pt will benefit from PT services to address deficits in pain in order to return to full function at home and work.  OBJECTIVE IMPAIRMENTS: decreased endurance, decreased strength, and pain.   ACTIVITY LIMITATIONS: sitting, sleeping, and bed mobility  PARTICIPATION LIMITATIONS: driving and occupation  PERSONAL FACTORS: Age, Past/current experiences, Sex, Time since onset of injury/illness/exacerbation, and 3+ comorbidities: Hx of HA, Syncopal Episodes, Anxiety   are also affecting  patient's functional outcome.   REHAB POTENTIAL: Good  CLINICAL DECISION MAKING: Evolving/moderate complexity  EVALUATION COMPLEXITY: Moderate   GOALS: Goals reviewed with patient? No  SHORT TERM GOALS: Target date: 08/12/2023  Pt will be independent with HEP to improve strength and decrease neck pain to improve pain-free function at home and work. Baseline:  Goal status: INITIAL   LONG TERM GOALS: Target date: 09/09/2023  Pt will increase FOTO to at least 76 to demonstrate significant improvement in function at home and work related to neck pain  Baseline: 07/14/2023: 75 Goal status: INITIAL  2.  Pt will decrease worst neck pain by at least 2 points on the NPRS in order to demonstrate clinically significant reduction in neck pain. Baseline: 07/14/2023: 7/10 Goal status: INITIAL  3.  Pt will decrease NDI score by at least 19% in order demonstrate clinically significant reduction in neck pain/disability.      Baseline: To be completed; 08/03/23: 8% Goal status: DISCONTINUED  4.  Pt will reports improvements in tinnitus and no pain with left lateral flexion and side bending in order to demonstrate improvements in functional mobility.  Baseline:  Goal status: INITIAL   PLAN: PT FREQUENCY: 1-2x/week  PT DURATION: 8 weeks  PLANNED INTERVENTIONS: Therapeutic exercises, Therapeutic activity, Neuromuscular re-education, Balance training, Gait training, Patient/Family education, Self Care, Joint mobilization, Joint manipulation, Vestibular training, Canalith repositioning, Orthotic/Fit training, DME instructions, Dry Needling, Electrical stimulation, Spinal manipulation, Spinal mobilization, Cryotherapy, Moist heat, Taping, Traction, Ultrasound, Ionotophoresis 4mg /ml Dexamethasone, Manual therapy, and Re-evaluation.  PLAN FOR NEXT SESSION: STM to upper trapezius and SCM, dry needling to active trigger points;   Sharalyn Ink Shabrea Weldin PT, DPT, GCS  Destony Prevost, PT 08/11/2023, 9:10  AM

## 2023-08-12 ENCOUNTER — Ambulatory Visit: Payer: BLUE CROSS/BLUE SHIELD

## 2023-08-17 ENCOUNTER — Ambulatory Visit: Payer: BLUE CROSS/BLUE SHIELD

## 2023-08-24 ENCOUNTER — Ambulatory Visit: Payer: BLUE CROSS/BLUE SHIELD

## 2023-08-24 DIAGNOSIS — M6281 Muscle weakness (generalized): Secondary | ICD-10-CM

## 2023-08-24 DIAGNOSIS — M542 Cervicalgia: Secondary | ICD-10-CM

## 2023-08-24 NOTE — Therapy (Cosign Needed)
OUTPATIENT PHYSICAL THERAPY NECK TREATMENT   Patient Name: Jordan Peters MRN: [id number] DOB:27-Nov-1965, 57 y.o., female Today's Date: 08/25/2023  END OF SESSION:  PT End of Session - 08/24/23 1407     Visit Number 6    Number of Visits 17    Date for PT Re-Evaluation 09/08/23    Authorization Type BCBS 2024  VL: 60 combined PT, OT, ST, RT  Copay $40  OOP: $3000/$2213.68 used  Ref#:[id number]    PT Start Time 1400    PT Stop Time 1445    PT Time Calculation (min) 45 min    Activity Tolerance Patient tolerated treatment well    Behavior During Therapy WFL for tasks assessed/performed             Past Medical History:  Diagnosis Date   Anxiety    Back pain    Clostridium difficile diarrhea    Colon polyp    Diverticulosis    GERD (gastroesophageal reflux disease)    Headache    Heart murmur    IBS (irritable bowel syndrome)    Lumbar herniated disc    PONV (postoperative nausea and vomiting) 12/2017   Tremor    Past Surgical History:  Procedure Laterality Date   COLONOSCOPY     DILATION AND CURETTAGE OF UTERUS     DILITATION & CURRETTAGE/HYSTROSCOPY WITH NOVASURE ABLATION N/A 03/12/2018   Procedure: fractional DILATATION & CURETTAGE/HYSTEROSCOPY WITH NOVASURE ABLATION;  Surgeon: Christeen Douglas, MD;  Location: [facility];  Service: Gynecology;  Laterality: N/A;   ESOPHAGOGASTRODUODENOSCOPY ENDOSCOPY     LUMBAR LAMINECTOMY/DECOMPRESSION MICRODISCECTOMY Right 02/25/2017   Procedure: LUMBAR LAMINECTOMY/DECOMPRESSION MICRODISCECTOMY 1 LEVEL;  Surgeon: Venetia Night, MD;  Location: [facility];  Service: Neurosurgery;  Laterality: Right;   LUMBAR LAMINECTOMY/DECOMPRESSION MICRODISCECTOMY Left 08/04/2018   Procedure: LUMBAR LAMINECTOMY/DECOMPRESSION MICRODISCECTOMY 1 LEVEL-L4-5;  Surgeon: Venetia Night, MD;  Location: [facility];  Service: Neurosurgery;  Laterality: Left;   POLYPECTOMY     uterine cyst     Patient Active Problem List   Diagnosis Date Noted    Syncope 02/27/2022   Anxiety    Hypokalemia    Vitamin D deficiency 12/19/2015    PCP: Orene Desanctis, MD  REFERRING PROVIDER: Andi Hence*  REFERRING DIAG:  M79.18 (ICD-10-CM) - Myalgia, other site  M26.603 (ICD-10-CM) - Bilateral temporomandibular joint disorder, unspecified  H93.A3 (ICD-10-CM) - Pulsatile tinnitus, bilateral    RATIONALE FOR EVALUATION AND TREATMENT: Rehabilitation  THERAPY DIAG: Muscle weakness (generalized)  Cervicalgia  ONSET DATE: May 2023   FOLLOW-UP APPT SCHEDULED WITH REFERRING PROVIDER: Deferred   FROM INITIAL EVALUATION SUBJECTIVE:  Chief Complaint: Neck Pain and Jaw Pain   Pertinent History Patient arrives to physical therapy with complaints of pulsating tinnitus and neck pain with cervical rotation more on the right in comparison to the left. Patient reported syncopal episode with a controlled fall (assisted by her spouse) in May 2023, since then her symptoms haven't resolved. She went to see her primary care, who referred her to neurologist (ordered Brain MRI, MRA, MRV, CT scans). Was also referred to neurosurgery. No one could find the cause of her pulsatile tinnitus. Patient reports aural pressure, tinnitus (increases with specific movements) , intermittent popping and clicking in her neck. She's attempted ice, heat, self massage with little improvement. Patient reports that when on a stationary bike exercise she does experience episodes of dizziness. She denies chills, fever, unexplained weight loss, double vision, radiating pain. She reports in 2016 that she experienced vertigo and described the room spinning; saw an ENT for canal repositioning  CT HEART ANGIOGRAM WITH FFRCT  IMPRESSION:  1. No CT evidence of coronary stenosis or plaque. Slightly high  takeoff of the left main coronary artery at the sinotubular junction.   2. Calcium Score: 0  3. CTFFR Analysis: CT FFR will not be performed for this study  4. Variant anatomy with diminutive mid-distal LAD in the setting of a dominant first diagonal branch, which extends to the LV apex.  5. Heavily thickened, bicuspid aortic valve. Correlate with echocardiography for aortic valve sclerosis or stenosis.  6. Multiple small, sub 4 mm pulmonary nodules. If the patient is at low risk for lung cancer, no further follow-up is recommended. If the patient is at high risk for lung cancer, consider 12 month follow-up CT. (2017 Fleischner Guidelines)   MRI BRAIN WITHOUT AND WITH CONTRAST  INDICATION: Headache, new or worsening (Age >= 50y), Pulsatile tinnitus, patient had MRI 05/20/22 at [facility] and 08/13/22 at [facility] showing 3mm of enhancement in inferior L frontal lobe, possible meningioma, vascular enhancement, or met, rec continued monitoring eval if inf L frontal lobe abnormality/enhancement still present, change/progressed, H93.A3 Pulsatile tinnitus, bilateral, R90.89 Other abnormal fi  COMPARISON: MRA 09/29/2022, MR 08/13/2022, CT 05/29/2022, 04/28/2022  IMPRESSION: 1. Unchanged appearance of extra-axial enhancing lesion arising from the left orbital roof favored to represent a meningioma. 2. Stable prominence of the pituitary gland with infundibular thickening and enhancement. Differential considerations are unchanged.   Procedure: CT TEMPORAL BONE WITHOUT CONTRAST  INDICATION: Tinnitus, pulsatile, H93.A3 Pulsatile tinnitus, bilateral, H93.8X3 Other specified disorders of ear, bilateral  COMPARISON: 06/25/2022 CT venogram  IMPRESSION: No evidence of semicircular canal dehiscence or other etiology for tinnitus.   Pain:  Pain Intensity: Present: 6-7/10, Best: 1/10, Worst: 7/10 Pain location: Upper Neck  Pain Quality:  Stiffness   Radiating: No  Numbness/Tingling: No Focal  Weakness: No Aggravating factors: Prolonged Sitting  Relieving factors: Ice, heat, Self massage  24-hour pain behavior: AM: "Not as bad" PM: Activity Dependent, Stress History of prior neck injury, pain, surgery, or therapy: No Dominant hand: right Imaging: Yes  Red flags (personal history of cancer, h/o spinal tumors, history of compression fracture, chills/fever, night sweats, nausea, vomiting, unrelenting pain): Negative  PRECAUTIONS: None  WEIGHT BEARING RESTRICTIONS: No  FALLS: Has patient fallen in last 6 months? 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  Recent memory is intact.  Remote memory is intact.  Attention span and concentration are intact.  Expressive speech is intact.  Patient's fund of knowledge is within normal limits for educational level.    Gross Musculoskeletal Assessment Tremor: None Bulk: Normal Tone: Normal  Gait Deferred  Posture Mild forward head posture, rounded shoulders   AROM AROM (Normal range in degrees) AROM  Cervical  Flexion (50) WNL   Extension (80) WNL  Right lateral flexion (45) WNL* with incr tinnitus   Left lateral flexion (45) WNL*   Right rotation (85) WNL with incr tinnitus   Left rotation (85) WNL*   (* = pain; Blank rows = not tested)  PROM PROM (Normal range in degrees) PROM  Cervical  Flexion (50)   Extension (80)   Right lateral flexion (45) WNL no pain  Left lateral flexion (45) WNL no pain   Right rotation (85) WNL no pain   Left rotation (85) WNL  no pain   (* = pain; Blank rows = not tested)  MMT MMT (out of 5) Right  Left  Cervical (isometric)  Flexion WNL  Extension WNL  Lateral Flexion WNL WNL  Rotation WNL WNL      Shoulder   Flexion    Extension    Abduction    Internal rotation    Horizontal abduction    Horizontal adduction    Lower Trapezius    Rhomboids    (* = pain; Blank rows = not tested)  Sensation Grossly intact to light touch bilateral UE as determined by testing dermatomes C2-T2. Proprioception and hot/cold testing deferred on this date.  Reflexes R/L Deferred   Palpation Location LEFT  RIGHT           Suboccipitals 1 1  Cervical paraspinals 1 1  Upper Trapezius 1 1  Levator Scapulae 1 1  Rhomboid Major/Minor 1 0  SCM 1 1  (Blank rows = not tested) Graded on 0-4 scale (0 = no pain, 1 = pain, 2 = pain with wincing/grimacing/flinching, 3 = pain with withdrawal, 4 = unwilling to allow palpation), (Blank rows = not tested)  Repeated Movements No centralization or peripheralization of symptoms with repeated cervical protraction and retraction.   Passive Accessory Intervertebral Motion Pt denies reproduction of back pain with CPA C1-C7 and UPA bilaterally C1-C7. Generally, normal mobility throughout.   SPECIAL TESTS Spurlings A (ipsilateral lateral flexion/axial compression): Deferred Spurlings B (ipsilateral lateral flexion/contralateral rotation/axial compression): Deferred Distraction Test: Negative   OCULOMOTOR / VESTIBULAR TESTING  Oculomotor Exam- Room Light  Findings Comments  Ocular Alignment normal   Ocular ROM normal   Spontaneous Nystagmus normal   Gaze-Holding Nystagmus normal   End-Gaze Nystagmus normal   Vergence (normal 2-3") not examined   Smooth Pursuit normal   Cross-Cover Test not examined   Saccades normal   VOR Cancellation normal   Left Head Impulse normal   Right Head Impulse normal   Static Acuity not examined   Dynamic Acuity not examined    Oculomotor  Exam- Fixation Suppressed  Findings Comments  Ocular Alignment normal   Spontaneous Nystagmus normal   Gaze-Holding Nystagmus normal   End-Gaze Nystagmus normal   Head Shaking Nystagmus normal   Pressure-Induced Nystagmus normal  Hyperventilation Induced Nystagmus normal   Skull Vibration Induced Nystagmus not examined    BPPV TESTS:  Symptoms Duration Intensity Nystagmus  L Dix-Hallpike None   None  R Dix-Hallpike None   None  L Head Roll None   None  R Head Roll None   None  L Sidelying Test None   None  R Sidelying Test None   None  (blank = not tested)    TODAY'S TREATMENT    SUBJECTIVE: Pt reports that she is doing well today. She denies any resting neck pain upon arrival. Possible slight improvement in tinnitus since starting therapy however it remains highly variable depending on the day and her activities.    PAIN: "A little bit" in the neck; additional pain noted in right shoulder;    Trigger Point Dry Needling (TDN), unbilled Education previously performed with patient regarding potential benefit of TDN. Previously reviewed precautions and risks with patient. Pt provided verbal consent to treatment. TDN performed to right upper trapezius (prone, posterior approach) with 2, 0.25 x 40 single needle placements. Pistoning technique utilized.    Manual Therapy  Light to Moderate STM to right upper trapezius, suboccipitals and levator scapulae;  Right upper trap, lateral flexion, and levator scapulae stretches x 30s/bout x 3 each ; Right upper Trap MET 30s/bout x 2;  Ther-ex  Cervical Neck Retraction with red tband 2 x 12 x 2s hold;  Standing Lat Pull Down 30# 2 x 12; Standing Cable Row 20# increased to 30#, 2 x 12;  Standing Face Pull 1 x 5 (Pain located in shoulder); Standing Shoulder External Rotation @ 90 abd 1 x 5 (pain located in shoulder);    PATIENT EDUCATION:  Education details: Pt educated throughout session about proper posture and technique with  exercises. Improved exercise technique, movement at target joints, use of target muscles after min to mod verbal, visual, tactile cues.  Person educated: Patient Education method: Explanation Education comprehension: verbalized understanding   HOME EXERCISE PROGRAM:  Access Code: [email or web address] URL: [email or web address] Date: 08/05/2023 Prepared by: Ria Comment  Exercises - Seated Upper Trapezius Stretch  - 2 x daily - 7 x weekly - 3 sets - 3 reps - 30-45s hold - Seated Levator Scapulae Stretch  - 2 x daily - 7 x weekly - 3 sets - 3 reps - 30-45s hold - Sternocleidomastoid Stretch  - 2 x daily - 7 x weekly - 3 sets - 3 reps - 30-45s hold - Sternocleidomastoid Release  - 2 x daily - 7 x weekly - Supine Chin Tuck  - 1 x daily - 7 x weekly - 2 sets - 10 reps - 5s hold - Seated Isometric Cervical Sidebending  - 1 x daily - 7 x weekly - 2 sets - 10 reps - 5s hold - Seated Isometric Cervical Rotation  - 1 x daily - 7 x weekly - 2 sets - 10 reps - 5s hold   ASSESSMENT:  CLINICAL IMPRESSION: Patient demonstrates excellent motivation during session. Progressed scapular strengthening in order to improve patient posture. Exercises limited due to reported pain in the right posterior shoulder. PT performed STM and TDN in order to improve pain in upper right neck. Updated HEP to include cervical retraction exercises. Pt encouraged to follow-up as scheduled. Pt will benefit from PT services to address deficits in pain in order to return to full function at home and work.  OBJECTIVE IMPAIRMENTS: decreased endurance, decreased strength, and pain.   ACTIVITY LIMITATIONS: sitting, sleeping, and bed  mobility  PARTICIPATION LIMITATIONS: driving and occupation  PERSONAL FACTORS: Age, Past/current experiences, Sex, Time since onset of injury/illness/exacerbation, and 3+ comorbidities: Hx of HA, Syncopal Episodes, Anxiety   are also affecting patient's functional outcome.   REHAB POTENTIAL:  Good  CLINICAL DECISION MAKING: Evolving/moderate complexity  EVALUATION COMPLEXITY: Moderate   GOALS: Goals reviewed with patient? No  SHORT TERM GOALS: Target date: 08/12/2023  Pt will be independent with HEP to improve strength and decrease neck pain to improve pain-free function at home and work. Baseline:  Goal status: INITIAL   LONG TERM GOALS: Target date: 09/09/2023  Pt will increase FOTO to at least 76 to demonstrate significant improvement in function at home and work related to neck pain  Baseline: 07/14/2023: 75 Goal status: INITIAL  2.  Pt will decrease worst neck pain by at least 2 points on the NPRS in order to demonstrate clinically significant reduction in neck pain. Baseline: 07/14/2023: 7/10 Goal status: INITIAL  3.  Pt will decrease NDI score by at least 19% in order demonstrate clinically significant reduction in neck pain/disability.      Baseline: To be completed; 08/03/23: 8% Goal status: DISCONTINUED  4.  Pt will reports improvements in tinnitus and no pain with left lateral flexion and side bending in order to demonstrate improvements in functional mobility.  Baseline:  Goal status: INITIAL   PLAN: PT FREQUENCY: 1-2x/week  PT DURATION: 8 weeks  PLANNED INTERVENTIONS: Therapeutic exercises, Therapeutic activity, Neuromuscular re-education, Balance training, Gait training, Patient/Family education, Self Care, Joint mobilization, Joint manipulation, Vestibular training, Canalith repositioning, Orthotic/Fit training, DME instructions, Dry Needling, Electrical stimulation, Spinal manipulation, Spinal mobilization, Cryotherapy, Moist heat, Taping, Traction, Ultrasound, Ionotophoresis 4mg /ml Dexamethasone, Manual therapy, and Re-evaluation.  PLAN FOR NEXT SESSION: STM to upper trapezius and SCM, dry needling to active trigger points;  Thayer Ohm Danyiel Crespin SPT Lynnea Maizes PT, DPT, GCS  Huprich,Jason, PT 08/25/2023, 3:32 PM

## 2023-09-03 ENCOUNTER — Ambulatory Visit: Payer: BLUE CROSS/BLUE SHIELD

## 2023-09-10 ENCOUNTER — Ambulatory Visit: Payer: BLUE CROSS/BLUE SHIELD | Attending: Pain Medicine

## 2023-09-10 DIAGNOSIS — M542 Cervicalgia: Secondary | ICD-10-CM | POA: Insufficient documentation

## 2023-09-10 DIAGNOSIS — M6281 Muscle weakness (generalized): Secondary | ICD-10-CM | POA: Insufficient documentation

## 2023-09-10 NOTE — Therapy (Signed)
OUTPATIENT PHYSICAL THERAPY NECK DISCHARGE   Patient Name: Jordan Peters MRN: [id number] DOB:1966/10/03, 57 y.o., female Today's Date: 09/11/2023  END OF SESSION:  PT End of Session - 09/10/23 1313     Visit Number 7    Number of Visits 18    Date for PT Re-Evaluation 09/17/23    Authorization Type BCBS 2024  VL: 60 combined PT, OT, ST, RT  Copay $40  OOP: $3000/$2213.68 used  Ref#:[id number]    PT Start Time 1315    PT Stop Time 1400    PT Time Calculation (min) 45 min    Activity Tolerance Patient tolerated treatment well    Behavior During Therapy WFL for tasks assessed/performed            Past Medical History:  Diagnosis Date   Anxiety    Back pain    Clostridium difficile diarrhea    Colon polyp    Diverticulosis    GERD (gastroesophageal reflux disease)    Headache    Heart murmur    IBS (irritable bowel syndrome)    Lumbar herniated disc    PONV (postoperative nausea and vomiting) 12/2017   Tremor    Past Surgical History:  Procedure Laterality Date   COLONOSCOPY     DILATION AND CURETTAGE OF UTERUS     DILITATION & CURRETTAGE/HYSTROSCOPY WITH NOVASURE ABLATION N/A 03/12/2018   Procedure: fractional DILATATION & CURETTAGE/HYSTEROSCOPY WITH NOVASURE ABLATION;  Surgeon: Christeen Douglas, MD;  Location: [facility];  Service: Gynecology;  Laterality: N/A;   ESOPHAGOGASTRODUODENOSCOPY ENDOSCOPY     LUMBAR LAMINECTOMY/DECOMPRESSION MICRODISCECTOMY Right 02/25/2017   Procedure: LUMBAR LAMINECTOMY/DECOMPRESSION MICRODISCECTOMY 1 LEVEL;  Surgeon: Venetia Night, MD;  Location: [facility];  Service: Neurosurgery;  Laterality: Right;   LUMBAR LAMINECTOMY/DECOMPRESSION MICRODISCECTOMY Left 08/04/2018   Procedure: LUMBAR LAMINECTOMY/DECOMPRESSION MICRODISCECTOMY 1 LEVEL-L4-5;  Surgeon: Venetia Night, MD;  Location: [facility];  Service: Neurosurgery;  Laterality: Left;   POLYPECTOMY     uterine cyst     Patient Active Problem List   Diagnosis Date Noted    Syncope 02/27/2022   Anxiety    Hypokalemia    Vitamin D deficiency 12/19/2015    PCP: Orene Desanctis, MD  REFERRING PROVIDER: Andi Hence*  REFERRING DIAG:  M79.18 (ICD-10-CM) - Myalgia, other site  M26.603 (ICD-10-CM) - Bilateral temporomandibular joint disorder, unspecified  H93.A3 (ICD-10-CM) - Pulsatile tinnitus, bilateral    RATIONALE FOR EVALUATION AND TREATMENT: Rehabilitation  THERAPY DIAG: Muscle weakness (generalized)  Cervicalgia  ONSET DATE: May 2023   FOLLOW-UP APPT SCHEDULED WITH REFERRING PROVIDER: Deferred   FROM INITIAL EVALUATION SUBJECTIVE:  Chief Complaint: Neck Pain and Jaw Pain   Pertinent History Patient arrives to physical therapy with complaints of pulsating tinnitus and neck pain with cervical rotation more on the right in comparison to the left. Patient reported syncopal episode with a controlled fall (assisted by her spouse) in May 2023, since then her symptoms haven't resolved. She went to see her primary care, who referred her to neurologist (ordered Brain MRI, MRA, MRV, CT scans). Was also referred to neurosurgery. No one could find the cause of her pulsatile tinnitus. Patient reports aural pressure, tinnitus (increases with specific movements) , intermittent popping and clicking in her neck. She's attempted ice, heat, self massage with little improvement. Patient reports that when on a stationary bike exercise she does experience episodes of dizziness. She denies chills, fever, unexplained weight loss, double vision, radiating pain. She reports in 2016 that she experienced vertigo and described the room spinning; saw an ENT for canal repositioning  CT HEART ANGIOGRAM WITH FFRCT  IMPRESSION:  1. No CT evidence of coronary stenosis or plaque. Slightly high  takeoff of the left main coronary artery at the sinotubular junction.   2. Calcium Score: 0  3. CTFFR Analysis: CT FFR will not be performed for this study  4. Variant anatomy with diminutive mid-distal LAD in the setting of a dominant first diagonal branch, which extends to the LV apex.  5. Heavily thickened, bicuspid aortic valve. Correlate with echocardiography for aortic valve sclerosis or stenosis.  6. Multiple small, sub 4 mm pulmonary nodules. If the patient is at low risk for lung cancer, no further follow-up is recommended. If the patient is at high risk for lung cancer, consider 12 month follow-up CT. (2017 Fleischner Guidelines)   MRI BRAIN WITHOUT AND WITH CONTRAST  INDICATION: Headache, new or worsening (Age >= 50y), Pulsatile tinnitus, patient had MRI 05/20/22 at [facility] and 08/13/22 at [facility] showing 3mm of enhancement in inferior L frontal lobe, possible meningioma, vascular enhancement, or met, rec continued monitoring eval if inf L frontal lobe abnormality/enhancement still present, change/progressed, H93.A3 Pulsatile tinnitus, bilateral, R90.89 Other abnormal fi  COMPARISON: MRA 09/29/2022, MR 08/13/2022, CT 05/29/2022, 04/28/2022  IMPRESSION: 1. Unchanged appearance of extra-axial enhancing lesion arising from the left orbital roof favored to represent a meningioma. 2. Stable prominence of the pituitary gland with infundibular thickening and enhancement. Differential considerations are unchanged.   Procedure: CT TEMPORAL BONE WITHOUT CONTRAST  INDICATION: Tinnitus, pulsatile, H93.A3 Pulsatile tinnitus, bilateral, H93.8X3 Other specified disorders of ear, bilateral  COMPARISON: 06/25/2022 CT venogram  IMPRESSION: No evidence of semicircular canal dehiscence or other etiology for tinnitus.   Pain:  Pain Intensity: Present: 6-7/10, Best: 1/10, Worst: 7/10 Pain location: Upper Neck  Pain Quality:  Stiffness   Radiating: No  Numbness/Tingling: No Focal  Weakness: No Aggravating factors: Prolonged Sitting  Relieving factors: Ice, heat, Self massage  24-hour pain behavior: AM: "Not as bad" PM: Activity Dependent, Stress History of prior neck injury, pain, surgery, or therapy: No Dominant hand: right Imaging: Yes  Red flags (personal history of cancer, h/o spinal tumors, history of compression fracture, chills/fever, night sweats, nausea, vomiting, unrelenting pain): Negative  PRECAUTIONS: None  WEIGHT BEARING RESTRICTIONS: No  FALLS: Has patient fallen in last 6 months? 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  Recent memory is intact.  Remote memory is intact.  Attention span and concentration are intact.  Expressive speech is intact.  Patient's fund of knowledge is within normal limits for educational level.    Gross Musculoskeletal Assessment Tremor: None Bulk: Normal Tone: Normal  Gait Deferred  Posture Mild forward head posture, rounded shoulders   AROM AROM (Normal range in degrees) AROM  Cervical  Flexion (50) WNL   Extension (80) WNL  Right lateral flexion (45) WNL* with incr tinnitus   Left lateral flexion (45) WNL*   Right rotation (85) WNL with incr tinnitus   Left rotation (85) WNL*   (* = pain; Blank rows = not tested)  PROM PROM (Normal range in degrees) PROM  Cervical  Flexion (50)   Extension (80)   Right lateral flexion (45) WNL no pain  Left lateral flexion (45) WNL no pain   Right rotation (85) WNL no pain   Left rotation (85) WNL  no pain   (* = pain; Blank rows = not tested)  MMT MMT (out of 5) Right  Left  Cervical (isometric)  Flexion WNL  Extension WNL  Lateral Flexion WNL WNL  Rotation WNL WNL      Shoulder   Flexion    Extension    Abduction    Internal rotation    Horizontal abduction    Horizontal adduction    Lower Trapezius    Rhomboids    (* = pain; Blank rows = not tested)  Sensation Grossly intact to light touch bilateral UE as determined by testing dermatomes C2-T2. Proprioception and hot/cold testing deferred on this date.  Reflexes R/L Deferred   Palpation Location LEFT  RIGHT           Suboccipitals 1 1  Cervical paraspinals 1 1  Upper Trapezius 1 1  Levator Scapulae 1 1  Rhomboid Major/Minor 1 0  SCM 1 1  (Blank rows = not tested) Graded on 0-4 scale (0 = no pain, 1 = pain, 2 = pain with wincing/grimacing/flinching, 3 = pain with withdrawal, 4 = unwilling to allow palpation), (Blank rows = not tested)  Repeated Movements No centralization or peripheralization of symptoms with repeated cervical protraction and retraction.   Passive Accessory Intervertebral Motion Pt denies reproduction of back pain with CPA C1-C7 and UPA bilaterally C1-C7. Generally, normal mobility throughout.   SPECIAL TESTS Spurlings A (ipsilateral lateral flexion/axial compression): Deferred Spurlings B (ipsilateral lateral flexion/contralateral rotation/axial compression): Deferred Distraction Test: Negative   OCULOMOTOR / VESTIBULAR TESTING  Oculomotor Exam- Room Light  Findings Comments  Ocular Alignment normal   Ocular ROM normal   Spontaneous Nystagmus normal   Gaze-Holding Nystagmus normal   End-Gaze Nystagmus normal   Vergence (normal 2-3") not examined   Smooth Pursuit normal   Cross-Cover Test not examined   Saccades normal   VOR Cancellation normal   Left Head Impulse normal   Right Head Impulse normal   Static Acuity not examined   Dynamic Acuity not examined    Oculomotor  Exam- Fixation Suppressed  Findings Comments  Ocular Alignment normal   Spontaneous Nystagmus normal   Gaze-Holding Nystagmus normal   End-Gaze Nystagmus normal   Head Shaking Nystagmus normal   Pressure-Induced Nystagmus normal  Hyperventilation Induced Nystagmus normal   Skull Vibration Induced Nystagmus not examined    BPPV TESTS:  Symptoms Duration Intensity Nystagmus  L Dix-Hallpike None   None  R Dix-Hallpike None   None  L Head Roll None   None  R Head Roll None   None  L Sidelying Test None   None  R Sidelying Test None   None  (blank = not tested)    TODAY'S TREATMENT    SUBJECTIVE: Pt reports that she is doing well today. She states that her tinnitus has not bothered her lately and reports approximately 70% improvement in tinnitus since starting with therapy. She denies any resting neck pain upon arrival. Her R shoulder pain has been improving and at this time doesn't feel like she needs to address it with therapy.    PAIN: Denies    Manual Therapy  Updated outcome measures (see goals); STM to bilateral upper trapezius, SCM, suboccipitals and levator scapulae;  Bilateral upper trap and lateral flexion stretches x 30s/bout x 2 each ;   Ther-ex  Supine cervical rotation and lateral flexion with manual resistance x 10 each bilaterally; Supine cervical protraction with manual resistance x 10; Supine cervical retractions with overpressure x 10; Seated Nautilus lat pull down 70# x 10, 80# x 10, 95# x 10; Seated Nautilus rows with 50# x 10, 60# x 10, 70# x 10; Discharge discussion;   PATIENT EDUCATION:  Education details: Discharge Person educated: Patient Education method: Explanation Education comprehension: verbalized understanding   HOME EXERCISE PROGRAM:  Access Code: [email or web address] URL: [email or web address] Date: 08/05/2023 Prepared by: Ria Comment  Exercises - Seated Upper Trapezius Stretch  - 2 x daily - 7 x weekly - 3 sets - 3  reps - 30-45s hold - Seated Levator Scapulae Stretch  - 2 x daily - 7 x weekly - 3 sets - 3 reps - 30-45s hold - Sternocleidomastoid Stretch  - 2 x daily - 7 x weekly - 3 sets - 3 reps - 30-45s hold - Sternocleidomastoid Release  - 2 x daily - 7 x weekly - Supine Chin Tuck  - 1 x daily - 7 x weekly - 2 sets - 10 reps - 5s hold - Seated Isometric Cervical Sidebending  - 1 x daily - 7 x weekly - 2 sets - 10 reps - 5s hold - Seated Isometric Cervical Rotation  - 1 x daily - 7 x weekly - 2 sets - 10 reps - 5s hold   ASSESSMENT:  CLINICAL IMPRESSION: Patient demonstrates excellent motivation during session. Updated outcome measures today. Her FOTO improved to 78 and her worst neck pain has decreased to 5/10. She relates most of her neck pain to posture at work. She states that her tinnitus has not bothered her lately and reports approximately 70% improvement in tinnitus symptoms since starting with therapy. Progressed cervical and scapular strengthening during session. At this time pt reports that she would like to progress independently at the gym and believes she has achieved satisfactory results from therapy. Pt will be discharged on this date.   OBJECTIVE IMPAIRMENTS: decreased endurance, decreased strength, and pain.   ACTIVITY LIMITATIONS: sitting, sleeping, and bed mobility  PARTICIPATION LIMITATIONS: driving and occupation  PERSONAL FACTORS: Age, Past/current experiences, Sex, Time since onset of injury/illness/exacerbation, and 3+ comorbidities: Hx of HA, Syncopal Episodes, Anxiety   are also affecting patient's functional outcome.   REHAB POTENTIAL: Good  CLINICAL DECISION MAKING: Evolving/moderate complexity  EVALUATION COMPLEXITY: Moderate   GOALS: Goals  reviewed with patient? No  SHORT TERM GOALS: Target date: 08/12/2023  Pt will be independent with HEP to improve strength and decrease neck pain to improve pain-free function at home and work. Baseline:  Goal status:  INITIAL   LONG TERM GOALS: Target date: 09/09/2023  Pt will increase FOTO to at least 76 to demonstrate significant improvement in function at home and work related to neck pain  Baseline: 07/14/2023: 75, 09/10/23: 78; Goal status: INITIAL  2.  Pt will decrease worst neck pain by at least 2 points on the NPRS in order to demonstrate clinically significant reduction in neck pain. Baseline: 07/14/2023: 7/10; 09/10/23: 5/10;  Goal status: ACHIEVED  3.  Pt will decrease NDI score by at least 19% in order demonstrate clinically significant reduction in neck pain/disability.      Baseline: To be completed; 08/03/23: 8% Goal status: DISCONTINUED  4.  Pt will reports improvements in tinnitus and no pain with left lateral flexion and side bending in order to demonstrate improvements in functional mobility.  Baseline: 09/10/23: "It hasn't been bothering me" Goal status: PROGRESSING   PLAN: PT FREQUENCY: 1-2x/week  PT DURATION: 8 weeks  PLANNED INTERVENTIONS: Therapeutic exercises, Therapeutic activity, Neuromuscular re-education, Balance training, Gait training, Patient/Family education, Self Care, Joint mobilization, Joint manipulation, Vestibular training, Canalith repositioning, Orthotic/Fit training, DME instructions, Dry Needling, Electrical stimulation, Spinal manipulation, Spinal mobilization, Cryotherapy, Moist heat, Taping, Traction, Ultrasound, Ionotophoresis 4mg /ml Dexamethasone, Manual therapy, and Re-evaluation.  PLAN FOR NEXT SESSION: Discharge  Sharalyn Ink [place] Yarbro PT, DPT, GCS  Laneisha Mino, PT 09/11/2023, 8:08 AM

## 2023-09-17 ENCOUNTER — Ambulatory Visit: Payer: BLUE CROSS/BLUE SHIELD

## 2023-09-22 ENCOUNTER — Ambulatory Visit: Payer: BLUE CROSS/BLUE SHIELD

## 2024-05-04 ENCOUNTER — Other Ambulatory Visit: Payer: Self-pay | Admitting: Obstetrics and Gynecology

## 2024-05-04 DIAGNOSIS — Z1231 Encounter for screening mammogram for malignant neoplasm of breast: Secondary | ICD-10-CM

## 2024-06-08 ENCOUNTER — Ambulatory Visit
Admission: RE | Admit: 2024-06-08 | Discharge: 2024-06-08 | Disposition: A | Discharge status: Home / Self Care | Source: Ambulatory Visit | Attending: Obstetrics and Gynecology | Admitting: Obstetrics and Gynecology

## 2024-06-08 DIAGNOSIS — Z1231 Encounter for screening mammogram for malignant neoplasm of breast: Secondary | ICD-10-CM | POA: Insufficient documentation
